# Patient Record
Sex: Female | Born: 1965 | Race: Black or African American | Hispanic: No | Marital: Single | Smoking: Never smoker
Health system: Southern US, Community
[De-identification: ages and names within clinical notes are randomized; demographics above are authoritative.]

## PROBLEM LIST (undated history)

## (undated) ENCOUNTER — Emergency Department (HOSPITAL_BASED_OUTPATIENT_CLINIC_OR_DEPARTMENT_OTHER): Payer: No Typology Code available for payment source | Source: Home / Self Care

## (undated) DIAGNOSIS — G4730 Sleep apnea, unspecified: Secondary | ICD-10-CM

## (undated) DIAGNOSIS — G8929 Other chronic pain: Secondary | ICD-10-CM

## (undated) DIAGNOSIS — M545 Low back pain, unspecified: Secondary | ICD-10-CM

## (undated) DIAGNOSIS — I10 Essential (primary) hypertension: Secondary | ICD-10-CM

## (undated) HISTORY — DX: Other chronic pain: G89.29

## (undated) HISTORY — DX: Low back pain, unspecified: M54.50

## (undated) HISTORY — PX: TUBAL LIGATION: SHX77

## (undated) HISTORY — PX: GASTRIC BYPASS: SHX52

## (undated) HISTORY — DX: Sleep apnea, unspecified: G47.30

## (undated) HISTORY — PX: BREAST REDUCTION SURGERY: SHX8

## (undated) HISTORY — PX: FOOT SURGERY: SHX648

## (undated) HISTORY — PX: ABDOMINAL HYSTERECTOMY: SHX81

---

## 2000-09-21 ENCOUNTER — Emergency Department (HOSPITAL_COMMUNITY): Admission: EM | Admit: 2000-09-21 | Discharge: 2000-09-21 | Payer: Self-pay

## 2001-01-04 ENCOUNTER — Emergency Department (HOSPITAL_COMMUNITY): Admission: EM | Admit: 2001-01-04 | Discharge: 2001-01-04 | Payer: Self-pay | Admitting: Emergency Medicine

## 2001-09-24 ENCOUNTER — Ambulatory Visit (HOSPITAL_COMMUNITY): Admission: RE | Admit: 2001-09-24 | Discharge: 2001-09-24 | Payer: Self-pay | Admitting: Otolaryngology

## 2001-10-04 ENCOUNTER — Encounter: Admission: RE | Admit: 2001-10-04 | Discharge: 2001-10-04 | Payer: Self-pay | Admitting: Otolaryngology

## 2001-10-04 ENCOUNTER — Encounter: Payer: Self-pay | Admitting: Otolaryngology

## 2001-10-28 ENCOUNTER — Ambulatory Visit (HOSPITAL_BASED_OUTPATIENT_CLINIC_OR_DEPARTMENT_OTHER): Admission: RE | Admit: 2001-10-28 | Discharge: 2001-10-28 | Payer: Self-pay | Admitting: Otolaryngology

## 2001-10-28 ENCOUNTER — Encounter (INDEPENDENT_AMBULATORY_CARE_PROVIDER_SITE_OTHER): Payer: Self-pay | Admitting: *Deleted

## 2001-11-22 ENCOUNTER — Encounter: Payer: Self-pay | Admitting: *Deleted

## 2001-11-22 ENCOUNTER — Encounter: Admission: RE | Admit: 2001-11-22 | Discharge: 2001-11-22 | Payer: Self-pay | Admitting: *Deleted

## 2001-12-14 ENCOUNTER — Encounter: Admission: RE | Admit: 2001-12-14 | Discharge: 2001-12-14 | Payer: Self-pay | Admitting: Family Medicine

## 2001-12-14 ENCOUNTER — Encounter: Payer: Self-pay | Admitting: Family Medicine

## 2003-05-31 ENCOUNTER — Encounter: Admission: RE | Admit: 2003-05-31 | Discharge: 2003-05-31 | Payer: Self-pay | Admitting: Family Medicine

## 2003-06-21 ENCOUNTER — Ambulatory Visit (HOSPITAL_COMMUNITY): Admission: RE | Admit: 2003-06-21 | Discharge: 2003-06-21 | Payer: Self-pay | Admitting: *Deleted

## 2003-06-21 ENCOUNTER — Encounter (INDEPENDENT_AMBULATORY_CARE_PROVIDER_SITE_OTHER): Payer: Self-pay | Admitting: Specialist

## 2008-01-26 ENCOUNTER — Emergency Department (HOSPITAL_BASED_OUTPATIENT_CLINIC_OR_DEPARTMENT_OTHER): Admission: EM | Admit: 2008-01-26 | Discharge: 2008-01-26 | Payer: Self-pay | Admitting: Emergency Medicine

## 2008-08-23 ENCOUNTER — Emergency Department (HOSPITAL_BASED_OUTPATIENT_CLINIC_OR_DEPARTMENT_OTHER): Admission: EM | Admit: 2008-08-23 | Discharge: 2008-08-23 | Payer: Self-pay | Admitting: Emergency Medicine

## 2010-09-20 NOTE — H&P (Signed)
[name], [name]                      ACCOUNT NO.:  [id number]   MEDICAL RECORD NO.:  [id number]                   PATIENT TYPE:  AMB   LOCATION:  SDC                                  FACILITY:  [facility]   PHYSICIAN:  [place] B. Earlene Plater, M.D.               DATE OF BIRTH:  1965/06/14   DATE OF ADMISSION:  DATE OF DISCHARGE:                                HISTORY & PHYSICAL   PREOPERATIVE DIAGNOSIS:  Abnormal uterine bleeding, possible endometrial  polyp.   HISTORY OF PRESENT ILLNESS:  A 45 year old African-American female gravida 2  para 2 with a long history of heavy vaginal bleeding which occurs  episodically.  There have been times in her life when she would only have  one or two menses a year and frequently skipped periods every month or so.  However, more recently she has had increasing vaginal bleeding, at times  daily.  Pelvic ultrasound and subsequent saline infusion ultrasound  suggestive of endometrial polyp.  Plans had been for hysteroscopy D&C on  June 28, 2003.  However, yesterday the patient presented to the office  with increasing heavy bleeding not controlled by norethindrone acetate 5 mg  three tablets daily.  Therefore, we have moved her surgery up to today for  [facility] and attempt at hysteroscopy.   PAST MEDICAL HISTORY:  IBS.   SURGICAL HISTORY:  1. Ear surgery.  2. Tubal ligation.  3. C-section x1.  4. Laparoscopy x1.   MEDICATIONS:  Zelnorm and Wellbutrin.   ALLERGIES:  None.   SOCIAL HISTORY:  No alcohol, tobacco, or other drugs.   FAMILY HISTORY:  Noncontributory.   REVIEW OF SYSTEMS:  Otherwise negative.   PHYSICAL EXAMINATION:  VITAL SIGNS:  Blood pressure 136/86, pulse 80, weight  228.  GENERAL:  Alert and oriented, no acute distress.  SKIN:  Warm and dry, no lesions.  HEART:  Regular rate and rhythm.  LUNGS:  Clear to auscultation.  ABDOMEN:  Obese.  Liver and spleen are normal.  No hernia.  No mass  palpable.  PELVIC:  Normal external  genitalia, vagina and cervix normal.  Moderate  amount of blood in the vagina, no active bleeding seen through the cervix.  The uterus is difficult to palpate due to body habitus.   Recent ultrasound in the office shows a retroverted, normal size uterus with  anterior focal endometrial thickenings consistent with polyps or  proliferative endometrium.   ASSESSMENT:  Abnormal bleeding; sonohysterogram suggestive of endometrial  polyps.   PLAN:  D&C and possible hysteroscopy with polyp removal.  Operative risks  discussed including infection, bleeding, uterine perforation, damage to  surrounding organs.  All questions answered; the patient wishes to proceed.  Gerri Spore B. Earlene Plater, M.D.    WBD/MEDQ  D:  06/21/2003  T:  06/21/2003  Job:  [id number]   cc:   Terrial Rhodes, N.P.  [facility]

## 2010-09-20 NOTE — Op Note (Signed)
[place]  Patient:    [name] Steele, [name] Steele Visit Number: [id number] MRN: [id number]          Service Type: DSU Location: [facility] Attending Physician:  Waldon Merl Dictated by:   Keturah Barre, M.D. Admit Date:  10/28/2001 Discharge Date: 10/28/2001   CC:         Greer Ee, M.D.   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  PROCEDURE:  Left mastoid tympanoplasty.  SURGEON:  Keturah Barre, M.D.  ANESTHESIA:  General endotracheal anesthesia with Onalee Hua A. Ivin Booty, M.D.  DESCRIPTION OF PROCEDURE:  The patient was placed in the supine position under general endotracheal anesthesia, the ear was prepped and draped using Betadine x3 and the usual ear drape, and then once the drape was completed and the scope was brought into place, the four quadrants external ear canal were injected with 1% Xylocaine with epinephrine and then a tympanotomy incision was carried out and canal skin and tympanic membrane was reflected anteriorly. The middle ear was found to be filled with cloudy fluid.  We suctioned this. The granulation tissue was noted around the cordotympany and stapes and this was removed for pathology and the exploration was extended high up into the pars flasita region and around the head of the malleolus where the pars flasita region which was perforated and showed a small amount of cholesteotomatous debris, but very small amount, mostly granulation tissue was removed.  The portion of the sac which was found was removed or exteriorized and then a graft will be taken and used to repair this area.  The osicular chain was checked, the round window reflex was present, though, the osicular chain appeared slightly  stiff.  The cordotympany was left intact.  The facial recess was in good condition as was the attic region.  We expected to find more granulation tissue and/or cholesteotomy in the attic region and therefore a postauricular incision was then made and the simple mastoidectomy was carried out where the mastoid bony structure was found to be filled with fluid or chickenfat type granulation tissue or just the heavy overgrowth of mastoid bone.  Once we got down to the antrum, we did find some more granulation tissue, but no more cholesteotoma.  The stapes short process was seen, the facial canal was found to be not dehiscent and the horizontal semicircular canal was also found to be within normal limits.  The tegman was also found to be in good condition as we were worried that there was going to be a dehiscence of the tegman.  This was not the case.  The sigmoid was also cleansed off and still some bone was left over this area protecting it from any suture trauma.  Once the mastoid was completed, then Gelfoam was placed within the attic and antrum region and closure with a double layer chromic catgut and then skin clips.  As the mastoid had been done, the temporalis fascia graft was taken and secured.  The temporalis fascia graft was then taken as we looked back into the external ear canal and we placed this over the pars flasita region and up the posterior and superior canal wall.  The remainder of the tympanic membrane and posterior canal wall was placed back in its original position and Gelfoam was then placed in the external ear  canal. The osicular chain was obviously found to be intact and stable and mobile. The Gelfoam was placed in the external ear canal followed by neosporin ointment, followed by cotton in the external ear canal, followed by a mastoid dressing. The patient tolerated the procedure well, facial nerve intact and is doing well postoperatively.  FOLLOW-UP:  In four days,  then in 2-1/2 weeks, then five weeks, then three months, six months, and a year. Dictated by:   Keturah Barre, M.D. Attending Physician:  Waldon Merl DD:  10/28/01 TD:  10/30/01 Job: [id number]

## 2010-09-20 NOTE — Op Note (Signed)
[name] Steele, [name] Steele                      ACCOUNT NO.:  [id number]   MEDICAL RECORD NO.:  [id number]                   PATIENT TYPE:  AMB   LOCATION:  SDC                                  [facility]ACILITY:  [facility]   PHYSICIAN:  [place] B. Earlene Plater, M.D.               DATE OF BIRTH:  1966/01/06   DATE OF PROCEDURE:  06/21/2003  DATE OF DISCHARGE:                                 OPERATIVE REPORT   PREOPERATIVE DIAGNOSES:  1. Abnormal bleeding not responding to medical management.  2. Suggestion of polyp on saline infusion ultrasound.   POSTOPERATIVE DIAGNOSES:  1. Abnormal bleeding not responding to medical management.  2. Suggestion of polyp on saline infusion ultrasound.   PROCEDURE:  Hysteroscopy D&C.   SURGEON:  [place] Holstein. Earlene Plater, M.D.   ANESTHESIA:  MAC and 20 mL 1% Nesacaine paracervical block.   SPECIMENS:  Endometrial curettings.   FINDINGS:  Shaggy endometrium; however, no polyps were identified.   FLUID DEFICIT:  20 mL.   BLOOD LOSS:  Less than 50 mL.   COMPLICATIONS:  None.   INDICATION:  Patient with a history of heavy and now daily bleeding.  Had  been controlled initially with progestin therapy; however, of late has been  bleeding heavily passing large clots with associated crampy pain.  Previous  saline infusion ultrasound in the office had been suggestive of endometrial  polyp.   DESCRIPTION OF PROCEDURE:  The patient taken to the operating room and MAC  anesthesia obtained.  She was placed in the Brandon stirrups and prepped and  draped in the standard fashion.  Then the bladder was emptied with a red  rubber catheter.   Exam under anesthesia showed a slightly enlarged, retroverted uterus.  No  adnexal masses palpable.   Paracervical block was placed and single-tooth placed on the anterior lip of  the cervix.  The cervix was patulous and allowed passage up to the #21  dilated without difficulty.   The endometrium was curetted, and a large amount of tissue  returned.  It  took several passes to obtain a gritty texture throughout.   The hysteroscope was then flushed with sorbitol and inserted.  Uterine  distention was marginal due to the patulous cervix.  I attempted to improve  this by doubly clamping the cervical os, and this improved the situation  somewhat.  Tubal ostia were visualized, and a shaggy endometrium seen.  But  no localized lesions or polyps were identified.   The curette was passed a few more times to clear the uterus and the  procedure terminated.  The bleeding at the end of the procedure was minimal.  The tenaculum was removed, and the cervix was hemostatic.   The patient tolerated the procedure well.  There were no complications.  She  was taken to the recovery room awake, alert, and in a stable condition.  Gerri Spore B. Earlene Plater, M.D.    WBD/MEDQ  D:  06/21/2003  T:  06/21/2003  Job:  [id number]   cc:   Tomi Bamberger, MD  [facility]. [facility]

## 2010-09-20 NOTE — H&P (Signed)
[place]  Patient:    [name] Steele, [name] Steele Visit Number: [id number] MRN: [id number]          Service Type: DSU Location: [facility] Attending Physician:  Waldon Merl Dictated by:   Keturah Barre, M.D. Admit Date:  10/28/2001 Discharge Date: 10/28/2001   CC:         Elvina Sidle, M.D.   History and Physical  HISTORY OF PRESENT ILLNESS: This is a 45 year old female, who enters with a long history of left mastoid problems.  She has had ear pain, infection, drainage.  She [place]. They said they were concerned about her left ear at that time.  She had been on multiple antibiotics and entered my office with external otitis media which, once we got it cleaned up, showed an attic traction pocket and perforation.  We placed her also on Cipro 750 mg and Floxin otic suspension. She did improve but then had further problem and more difficulty resolving this problem.  She continued to have pain in her left ear, continued to have a small amount of drainage though the ear resolved a considerable amount of the infection.  Once the infection was markedly improved we then obtained an audiogram, which showed a sensorineural hearing loss, assumed to be secondary to the previous ear infections.  It was quite symmetrical, and then we had a conductive hearing loss that was superimposed beyond that, giving her a sensorineural loss of approximately 40 decibels or 35 decibels and then another conductive element of 35 decibels in the lower frequencies.  Total left SRT was 55, on the right it was 30.  Flat tympanogram on the left and a normal tympanogram on the right.  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  With this we decided to do a left mastoid tympanoplasty to repair this attic retraction pocket and cholesteatoma, to repair the ossicular chain if necessary, and do mastoidectomy to resolve this either granulation of cholesteatoma as the CAT scan showed.  She now enters for this procedure, left mastoid tympanoplasty.  PAST MEDICAL HISTORY: Unremarkable.  She has never had any heart, pulmonary, or GI problems.  She goes to [facility], Dr. Elvina Sidle.  PAST SURGICAL HISTORY:  1. Laparoscopy in 2001.  2. Colonoscopy recently also.  ALLERGIES: She has no allergies to medications.  CURRENT MEDICATIONS: She takes no medications.  SOCIAL HISTORY: She does not drink and does not smoke.  PHYSICAL EXAMINATION:  GENERAL: She is a well-nourished, well-developed black female in no acute distress.  VITAL SIGNS: Weight 200 pounds.  Blood pressure 121/87, heart rate 85.  HEENT: The ear on the right looks quite clear except there is scar tissue from previous infection.  On the left she has an attic cholesteatoma and retraction pocket, but cleared the external otitis media reasonably well.  She has conductive hearing loss and an immobile tympanic membrane.  Mastoid is no longer tender.  The oral cavity is clear.  The nose is clear.  The larynx is free of any ulceration or mass.  No ulcerations or mass of lips,  teeth, or gums.  NECK: Free of any thyromegaly, cervical adenopathy, or mass.  CHEST: Clear.  No rales, rhonchi, or wheezes.  CARDIOVASCULAR: No murmurs or gallops.  ABDOMEN: Mildly obese.  EXTREMITIES: Unremarkable.  NEUROLOGIC: Cranial nerves intact facial, EOM mobility, tongue mobility, gag reflex, shoulder strength.  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 Dictated by:   Keturah Barre, M.D. Attending Physician:  Waldon Merl DD:  10/28/01 TD:  10/30/01 Job: [id number]

## 2010-12-02 DIAGNOSIS — E785 Hyperlipidemia, unspecified: Secondary | ICD-10-CM | POA: Insufficient documentation

## 2010-12-02 DIAGNOSIS — E559 Vitamin D deficiency, unspecified: Secondary | ICD-10-CM | POA: Insufficient documentation

## 2011-01-20 DIAGNOSIS — E538 Deficiency of other specified B group vitamins: Secondary | ICD-10-CM | POA: Insufficient documentation

## 2011-02-03 LAB — URINALYSIS, ROUTINE W REFLEX MICROSCOPIC
Bilirubin Urine: NEGATIVE
Glucose, UA: NEGATIVE
Hgb urine dipstick: NEGATIVE
Ketones, ur: NEGATIVE
Nitrite: NEGATIVE
Protein, ur: NEGATIVE
Specific Gravity, Urine: 1.017
Urobilinogen, UA: 1
pH: 7.5

## 2011-02-25 ENCOUNTER — Ambulatory Visit: Payer: Self-pay | Admitting: Specialist

## 2011-03-26 ENCOUNTER — Ambulatory Visit: Payer: Self-pay | Admitting: Specialist

## 2011-03-31 DIAGNOSIS — K5909 Other constipation: Secondary | ICD-10-CM | POA: Insufficient documentation

## 2011-03-31 DIAGNOSIS — K5904 Chronic idiopathic constipation: Secondary | ICD-10-CM | POA: Insufficient documentation

## 2011-04-05 ENCOUNTER — Ambulatory Visit: Payer: Self-pay | Admitting: Specialist

## 2011-05-20 DIAGNOSIS — G4733 Obstructive sleep apnea (adult) (pediatric): Secondary | ICD-10-CM | POA: Insufficient documentation

## 2011-06-04 ENCOUNTER — Ambulatory Visit: Payer: Self-pay | Admitting: Gastroenterology

## 2011-06-06 LAB — PATHOLOGY REPORT

## 2011-06-13 DIAGNOSIS — K5790 Diverticulosis of intestine, part unspecified, without perforation or abscess without bleeding: Secondary | ICD-10-CM | POA: Insufficient documentation

## 2011-10-06 ENCOUNTER — Ambulatory Visit: Payer: Self-pay | Admitting: Specialist

## 2012-03-11 ENCOUNTER — Ambulatory Visit: Payer: Self-pay | Admitting: Gastroenterology

## 2012-03-11 ENCOUNTER — Other Ambulatory Visit: Payer: Self-pay | Admitting: Specialist

## 2012-03-11 LAB — AMYLASE: Amylase: 27 U/L (ref 25–115)

## 2012-03-11 LAB — CBC WITH DIFFERENTIAL/PLATELET
Basophil %: 1.1 %
Eosinophil #: 0.1 10*3/uL (ref 0.0–0.7)
Eosinophil %: 1.9 %
HGB: 13.1 g/dL (ref 12.0–16.0)
Lymphocyte %: 34.9 %
MCH: 31.4 pg (ref 26.0–34.0)
MCHC: 34.7 g/dL (ref 32.0–36.0)
Monocyte #: 0.2 x10 3/mm (ref 0.2–0.9)
Neutrophil %: 57.4 %
Platelet: 299 10*3/uL (ref 150–440)
RBC: 4.18 10*6/uL (ref 3.80–5.20)

## 2012-03-11 LAB — COMPREHENSIVE METABOLIC PANEL
Anion Gap: 11 (ref 7–16)
Bilirubin,Total: 0.5 mg/dL (ref 0.2–1.0)
Chloride: 110 mmol/L — ABNORMAL HIGH (ref 98–107)
EGFR (African American): >60
Osmolality: 286 (ref 275–301)
Potassium: 3.5 mmol/L (ref 3.5–5.1)
SGOT(AST): 21 U/L (ref 15–37)
Total Protein: 7.1 g/dL (ref 6.4–8.2)

## 2012-04-29 ENCOUNTER — Ambulatory Visit: Payer: Self-pay | Admitting: Gastroenterology

## 2012-09-07 ENCOUNTER — Emergency Department (HOSPITAL_BASED_OUTPATIENT_CLINIC_OR_DEPARTMENT_OTHER)
Admission: EM | Admit: 2012-09-07 | Discharge: 2012-09-07 | Disposition: A | Discharge status: Home / Self Care | Payer: Medicare Other | Attending: Emergency Medicine | Admitting: Emergency Medicine

## 2012-09-07 ENCOUNTER — Encounter (HOSPITAL_BASED_OUTPATIENT_CLINIC_OR_DEPARTMENT_OTHER): Payer: Self-pay | Admitting: *Deleted

## 2012-09-07 DIAGNOSIS — R55 Syncope and collapse: Secondary | ICD-10-CM

## 2012-09-07 LAB — COMPREHENSIVE METABOLIC PANEL
ALT: 21 U/L (ref 0–35)
Alkaline Phosphatase: 164 U/L — ABNORMAL HIGH (ref 39–117)
BUN: 12 mg/dL (ref 6–23)
CO2: 24 mEq/L (ref 19–32)
Chloride: 106 mEq/L (ref 96–112)
GFR calc Af Amer: >90 mL/min (ref >90)
GFR calc non Af Amer: >90 mL/min (ref >90)
Glucose, Bld: 94 mg/dL (ref 70–99)
Potassium: 3.9 mEq/L (ref 3.5–5.1)
Sodium: 140 mEq/L (ref 135–145)
Total Bilirubin: 0.2 mg/dL — ABNORMAL LOW (ref 0.3–1.2)

## 2012-09-07 LAB — URINALYSIS, ROUTINE W REFLEX MICROSCOPIC
Hgb urine dipstick: NEGATIVE
Nitrite: NEGATIVE
Specific Gravity, Urine: 1.034 — ABNORMAL HIGH (ref 1.005–1.030)
Urobilinogen, UA: 1 mg/dL (ref 0.0–1.0)
pH: 5.5 (ref 5.0–8.0)

## 2012-09-07 LAB — CBC WITH DIFFERENTIAL/PLATELET
Hemoglobin: 12.9 g/dL (ref 12.0–15.0)
Lymphocytes Relative: 41 % (ref 12–46)
Lymphs Abs: 2.8 10*3/uL (ref 0.7–4.0)
Monocytes Relative: 6 % (ref 3–12)
Neutrophils Relative %: 51 % (ref 43–77)
Platelets: 250 10*3/uL (ref 150–400)
RBC: 3.99 MIL/uL (ref 3.87–5.11)
WBC: 6.7 10*3/uL (ref 4.0–10.5)

## 2012-09-07 LAB — TROPONIN I: Troponin I: <0.3 ng/mL (ref <0.30)

## 2012-09-07 NOTE — ED Provider Notes (Signed)
History  This chart was scribed for [name] Octave, MD by [name] Steele, ED Scribe. The patient was seen in room MH12/MH12. Patient's care was started at 0029.   CSN: [id number]  Arrival date & time 09/07/12  0003   First MD Initiated Contact with Patient 09/07/12 0029      Chief Complaint  Patient presents with  . Dizziness    The history is provided by the patient. No language interpreter was used.    HPI Comments: [name] Steele is a 47 y.o. female who presents to the Emergency Department complaining of intermittent, brief episodes dizziness, lightheadedness and near syncope onset 1 day ago. She states that these episodes usually occur with position changes and after standing up and last several seconds. She says that she is feeling normal at this time. Patient reports a history of hypokalemia. She is concerned that today's symptoms are related to low potassium levels. Patient denies shortness of breath, chest pain, abdominal pain, nausea, vomiting, diarrhea, headaches or any other symptoms. She reports no other chronic medical conditions.  History reviewed. No pertinent past medical history.  Past Surgical History  Procedure Laterality Date  . Gastric bypass    . Abdominal hysterectomy      No family history on file.  History  Substance Use Topics  . Smoking status: Never Smoker   . Smokeless tobacco: Not on file  . Alcohol Use: No    OB History   Grav Para Term Preterm Abortions TAB SAB Ect Mult Living                  Review of Systems A complete 10 system review of systems was obtained and all systems are negative except as noted in the HPI and PMH.   Allergies  Review of patient's allergies indicates no known allergies.  Home Medications  No current outpatient prescriptions on file.  Triage Vitals: BP 125/93  Pulse 60  Temp(Src) 98.3 F (36.8 C) (Oral)  Resp 20  Ht 5\' 6"  (1.676 m)  Wt 179 lb (81.194 kg)  BMI 28.91 kg/m2  SpO2 98%  Physical Exam   Constitutional: She is oriented to person, place, and time. She appears well-developed and well-nourished.  HENT:  Head: Normocephalic and atraumatic.  Dry mucous membranes.  Eyes: Conjunctivae and EOM are normal. Pupils are equal, round, and reactive to light.  Neck: Normal range of motion. Neck supple.  Cardiovascular: Normal rate, regular rhythm and normal heart sounds.   Pulmonary/Chest: Effort normal and breath sounds normal.  Abdominal: Soft. There is no tenderness.  Neurological: She is alert and oriented to person, place, and time. She has normal reflexes. No cranial nerve deficit. She exhibits normal muscle tone. She displays a negative Romberg sign. Coordination normal.  No ataxia on finger to nose bilaterally. No pronator drift. 5/5 strength throughout. CN 2-12 intact. Equal grip strength. Sensation intact. Gait is normal. Romberg negative.  Skin: Skin is warm and dry.    ED Course  Procedures (including critical care time) DIAGNOSTIC STUDIES: Oxygen Saturation is 98% on room air, normal by my interpretation.    COORDINATION OF CARE: 12:49 AM- Patient informed of current plan for treatment and evaluation and agrees with plan at this time.      Labs Reviewed  URINALYSIS, ROUTINE W REFLEX MICROSCOPIC - Abnormal; Notable for the following:    Specific Gravity, Urine 1.034 (*)    Bilirubin Urine SMALL (*)    All other components within normal limits  COMPREHENSIVE  METABOLIC PANEL - Abnormal; Notable for the following:    Alkaline Phosphatase 164 (*)    Total Bilirubin 0.2 (*)    All other components within normal limits  CBC WITH DIFFERENTIAL  TROPONIN I   No results found.   1. Near syncope       MDM  3 episodes of dizziness with standing today and intermittent. Concerned about low potassium. No chest pain or shortness of breath.  Patient's friend states patient doesn't drink much water.  Positional dizziness throughout the day today. Concerned about low  potassium. Denies vertigo.  Endorses lightheadedness with standing.   Nonfocal neurological exam. Normal gait. No ataxia. Romberg negative. Doubt posterior CVA.  EKG normal sinus rhythm. Electrolytes normal.  Feeling at baseline in the ED. Ambulatory without dizziness or lightheadedness.   Date: 09/07/2012  Rate: 64  Rhythm: normal sinus rhythm  QRS Axis: normal  Intervals: normal  ST/T Wave abnormalities: normal  Conduction Disutrbances:none  Narrative Interpretation:   Old EKG Reviewed: none available     I personally performed the services described in this documentation, which was scribed in my presence. The recorded information has been reviewed and is accurate.    [name] Octave, MD 09/07/12 [phone]

## 2012-09-07 NOTE — ED Notes (Signed)
Has been having dizziness and near syncope spells through out the day. States she feels like her K+ is the cause. She is alert and oriented at triage.

## 2012-09-07 NOTE — ED Notes (Signed)
MD at bedside. 

## 2012-11-12 ENCOUNTER — Ambulatory Visit: Payer: Self-pay | Admitting: Gastroenterology

## 2012-11-22 ENCOUNTER — Ambulatory Visit: Payer: Self-pay | Admitting: Gastroenterology

## 2013-01-05 ENCOUNTER — Encounter (HOSPITAL_BASED_OUTPATIENT_CLINIC_OR_DEPARTMENT_OTHER): Payer: Self-pay | Admitting: Student

## 2013-01-05 ENCOUNTER — Emergency Department (HOSPITAL_BASED_OUTPATIENT_CLINIC_OR_DEPARTMENT_OTHER)
Admission: EM | Admit: 2013-01-05 | Discharge: 2013-01-05 | Disposition: A | Discharge status: Home / Self Care | Payer: Medicare Other | Attending: Emergency Medicine | Admitting: Emergency Medicine

## 2013-01-05 DIAGNOSIS — S139XXA Sprain of joints and ligaments of unspecified parts of neck, initial encounter: Secondary | ICD-10-CM | POA: Insufficient documentation

## 2013-01-05 DIAGNOSIS — Y9389 Activity, other specified: Secondary | ICD-10-CM | POA: Insufficient documentation

## 2013-01-05 DIAGNOSIS — S161XXA Strain of muscle, fascia and tendon at neck level, initial encounter: Secondary | ICD-10-CM

## 2013-01-05 DIAGNOSIS — Y92410 Unspecified street and highway as the place of occurrence of the external cause: Secondary | ICD-10-CM | POA: Insufficient documentation

## 2013-01-05 MED ORDER — HYDROCODONE-ACETAMINOPHEN 5-325 MG PO TABS: 2.0000 | ORAL_TABLET | ORAL | Status: DC | PRN

## 2013-01-05 MED ORDER — HYDROCODONE-ACETAMINOPHEN 5-325 MG PO TABS
2.0000 | ORAL_TABLET | Freq: Once | ORAL | Status: AC
  Administered 2013-01-05: 2 via ORAL
  Filled 2013-01-05: qty 2

## 2013-01-05 NOTE — ED Provider Notes (Signed)
Medical screening examination/treatment/procedure(s) were performed by non-physician practitioner and as supervising physician I was immediately available for consultation/collaboration.   Charles B. Sheldon, MD 01/05/13 2305 
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
## 2013-01-05 NOTE — ED Notes (Signed)
PA at bedside.
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
## 2013-01-10 ENCOUNTER — Emergency Department (HOSPITAL_BASED_OUTPATIENT_CLINIC_OR_DEPARTMENT_OTHER)
Admission: EM | Admit: 2013-01-10 | Discharge: 2013-01-10 | Disposition: A | Discharge status: Home / Self Care | Payer: Medicare Other | Attending: Emergency Medicine | Admitting: Emergency Medicine

## 2013-01-10 ENCOUNTER — Encounter (HOSPITAL_BASED_OUTPATIENT_CLINIC_OR_DEPARTMENT_OTHER): Payer: Self-pay

## 2013-01-10 DIAGNOSIS — S0990XD Unspecified injury of head, subsequent encounter: Secondary | ICD-10-CM

## 2013-01-10 DIAGNOSIS — R51 Headache: Secondary | ICD-10-CM | POA: Insufficient documentation
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
## 2013-11-03 ENCOUNTER — Encounter (HOSPITAL_BASED_OUTPATIENT_CLINIC_OR_DEPARTMENT_OTHER): Payer: Self-pay | Admitting: Emergency Medicine

## 2013-11-03 ENCOUNTER — Emergency Department (HOSPITAL_BASED_OUTPATIENT_CLINIC_OR_DEPARTMENT_OTHER)
Admission: EM | Admit: 2013-11-03 | Discharge: 2013-11-03 | Disposition: A | Discharge status: Home / Self Care | Payer: Medicare HMO | Attending: Emergency Medicine | Admitting: Emergency Medicine

## 2013-11-03 DIAGNOSIS — L509 Urticaria, unspecified: Secondary | ICD-10-CM | POA: Diagnosis not present

## 2013-11-03 DIAGNOSIS — R21 Rash and other nonspecific skin eruption: Secondary | ICD-10-CM | POA: Diagnosis present

## 2013-11-03 MED ORDER — HYDROCORTISONE 1 % EX CREA: TOPICAL_CREAM | CUTANEOUS | Status: DC

## 2013-11-03 MED ORDER — HYDROXYZINE HCL 25 MG PO TABS: 25.0000 mg | ORAL_TABLET | Freq: Four times a day (QID) | ORAL | Status: DC

## 2013-11-03 NOTE — ED Provider Notes (Signed)
CSN: [id number]     Arrival date & time 11/03/13  0713 History  This chart was scribed for [name], MD by Shari Heritage, ED Scribe. The patient was seen in room MHTR2/MHTR2. Patient's care was started at 7:44 AM.   Chief Complaint  Patient presents with  . Rash    The history is provided by the patient. No language interpreter was used.    HPI Comments: [name] Steele is a 48 y.o. female who presents to the Emergency Department complaining of gradually worsening, mild, pruritic rash to her upper and lower extremities that began last night. She says that rash is not painful. Patient states that she first noticed a bump that looked like a mosquito bite, then when she woke up this morning she noticed rash had spread. Patient reports that no one at home as a similar rash. There is no oral or genital involvement. Patient denies recent travel. She denies tick bites or spending times in the woods recently. She denies use of new lotions, detergents, soaps. She has not worn any new clothes or eaten new foods. She has not taken any medications for symptoms relief - she says that she took Benadryl 1 year ago after gastric bypass surgery, but she says it made her itch more so she has not tried this today. She denies shortness of breath, chest pain, dysphagia, sore throat, or other symptoms at this time. Patient has a history of back pain. She does not take any medications on a regular basis.   History reviewed. No pertinent past medical history. Past Surgical History  Procedure Laterality Date  . Gastric bypass    . Abdominal hysterectomy     History reviewed. No pertinent family history. History  Substance Use Topics  . Smoking status: Never Smoker   . Smokeless tobacco: Not on file  . Alcohol Use: No   OB History   Grav Para Term Preterm Abortions TAB SAB Ect Mult Living                 Review of Systems A complete 10 system review of systems was obtained and all systems are negative  except as noted in the HPI and PMH.   Allergies  Review of patient's allergies indicates no known allergies.  Home Medications   Prior to Admission medications   Medication Sig Start Date End Date Taking? Authorizing Provider  hydrocortisone cream 1 % Apply to affected area 2 times daily 11/03/13   [name], MD  hydrOXYzine (ATARAX/VISTARIL) 25 MG tablet Take 1 tablet (25 mg total) by mouth every 6 (six) hours. 11/03/13   [name], MD   Triage Vitals: BP 137/97  Pulse 76  Temp(Src) 97.5 F (36.4 C) (Oral)  Resp 18  Ht 5\' 6"  (1.676 m)  Wt 170 lb (77.111 kg)  BMI 27.45 kg/m2  SpO2 100%  Physical Exam  Nursing note and vitals reviewed. Constitutional: She is oriented to person, place, and time. She appears well-developed and well-nourished. No distress.  HENT:  Head: Normocephalic and atraumatic.  Mouth/Throat: Oropharynx is clear and moist. No oropharyngeal exudate.  Eyes: Conjunctivae and EOM are normal. Pupils are equal, round, and reactive to light.  Neck: Normal range of motion. Neck supple.  No meningismus.  Cardiovascular: Normal rate, regular rhythm, normal heart sounds and intact distal pulses.   No murmur heard. Pulmonary/Chest: Effort normal and breath sounds normal. No respiratory distress. She has no wheezes. She has no rales.  Abdominal: Soft. There is no  tenderness. There is no rebound and no guarding.  Musculoskeletal: Normal range of motion. She exhibits no edema and no tenderness.  Neurological: She is alert and oriented to person, place, and time. No cranial nerve deficit. She exhibits normal muscle tone. Coordination normal.  No ataxia on finger to nose bilaterally. No pronator drift. 5/5 strength throughout. CN 2-12 intact. Negative Romberg. Equal grip strength. Sensation intact. Gait is normal.   Skin: Skin is warm. Rash noted. Rash is urticarial.  Scattered urticaria most noticeable on bilateral posterior upper arms and medial thighs.   Psychiatric: She has a normal mood and affect. Her behavior is normal.    ED Course  Procedures (including critical care time) DIAGNOSTIC STUDIES: Oxygen Saturation is 100% on room air, normal by my interpretation.    COORDINATION OF CARE: 7:48 AM- Patient informed of current plan for treatment and evaluation and agrees with plan at this time.    MDM   Final diagnoses:  Urticaria   Urticaria to bilateral posterior arms and medial thighs.  No oral or genital involvement. OP clear.   No CP or SOB. Denies new exposures. Questionable insect bite.  Patient reports benadryl "makes her itch". Treat with hydrocortisone and atarax.  Follow up with PCP. Return precautions discussed.   I personally performed the services described in this documentation, which was scribed in my presence. The recorded information has been reviewed and is accurate.   Glynn OctaveStephen Demetria Iwai, MD 11/03/13 [phone]

## 2013-11-03 NOTE — Discharge Instructions (Signed)
Hives Hives are itchy, red, swollen areas of the skin. They can vary in size and location on your body. Hives can come and go for hours or several days (acute hives) or for several weeks (chronic hives). Hives do not spread from person to person (noncontagious). They may get worse with scratching, exercise, and emotional stress. CAUSES   Allergic reaction to food, additives, or drugs.  Infections, including the common cold.  Illness, such as vasculitis, lupus, or thyroid disease.  Exposure to sunlight, heat, or cold.  Exercise.  Stress.  Contact with chemicals. SYMPTOMS   Red or white swollen patches on the skin. The patches may change size, shape, and location quickly and repeatedly.  Itching.  Swelling of the hands, feet, and face. This may occur if hives develop deeper in the skin. DIAGNOSIS  Your caregiver can usually tell what is wrong by performing a physical exam. Skin or blood tests may also be done to determine the cause of your hives. In some cases, the cause cannot be determined. TREATMENT  Mild cases usually get better with medicines such as antihistamines. Severe cases may require an emergency epinephrine injection. If the cause of your hives is known, treatment includes avoiding that trigger.  HOME CARE INSTRUCTIONS   Avoid causes that trigger your hives.  Take antihistamines as directed by your caregiver to reduce the severity of your hives. Non-sedating or low-sedating antihistamines are usually recommended. Do not drive while taking an antihistamine.  Take any other medicines prescribed for itching as directed by your caregiver.  Wear loose-fitting clothing.  Keep all follow-up appointments as directed by your caregiver. SEEK MEDICAL CARE IF:   You have persistent or severe itching that is not relieved with medicine.  You have painful or swollen joints. SEEK IMMEDIATE MEDICAL CARE IF:   You have a fever.  Your tongue or lips are swollen.  You have  trouble breathing or swallowing.  You feel tightness in the throat or chest.  You have abdominal pain. These problems may be the first sign of a life-threatening allergic reaction. Call your local emergency services (911 in [place] MAKE SURE YOU:   Understand these instructions.  Will watch your condition.  Will get help right away if you are not doing well or get worse. Document Released: 04/21/2005 Document Revised: 04/26/2013 Document Reviewed: 07/15/2011 [employer] Patient Information 2015 [employer]. This information is not intended to replace advice given to you by your health care provider. Make sure you discuss any questions you have with your health care provider.  

## 2013-11-03 NOTE — ED Notes (Signed)
Pt reports rash since last night. No meds PTA

## 2014-08-29 ENCOUNTER — Encounter (HOSPITAL_BASED_OUTPATIENT_CLINIC_OR_DEPARTMENT_OTHER): Payer: Self-pay | Admitting: *Deleted

## 2014-08-29 ENCOUNTER — Emergency Department (HOSPITAL_BASED_OUTPATIENT_CLINIC_OR_DEPARTMENT_OTHER): Payer: Medicare HMO

## 2014-08-29 ENCOUNTER — Emergency Department (HOSPITAL_BASED_OUTPATIENT_CLINIC_OR_DEPARTMENT_OTHER)
Admission: EM | Admit: 2014-08-29 | Discharge: 2014-08-30 | Disposition: A | Discharge status: Home / Self Care | Payer: Medicare HMO | Attending: Emergency Medicine | Admitting: Emergency Medicine

## 2014-08-29 DIAGNOSIS — R0789 Other chest pain: Secondary | ICD-10-CM

## 2014-08-29 DIAGNOSIS — R52 Pain, unspecified: Secondary | ICD-10-CM

## 2014-08-29 DIAGNOSIS — Z79899 Other long term (current) drug therapy: Secondary | ICD-10-CM | POA: Insufficient documentation

## 2014-08-29 DIAGNOSIS — R079 Chest pain, unspecified: Secondary | ICD-10-CM | POA: Diagnosis present

## 2014-08-29 DIAGNOSIS — Z7952 Long term (current) use of systemic steroids: Secondary | ICD-10-CM | POA: Insufficient documentation

## 2014-08-29 DIAGNOSIS — M546 Pain in thoracic spine: Secondary | ICD-10-CM | POA: Diagnosis not present

## 2014-08-29 LAB — CBC WITH DIFFERENTIAL/PLATELET
BASOS ABS: 0 10*3/uL (ref 0.0–0.1)
Basophils Relative: 0 % (ref 0–1)
EOS ABS: 0.1 10*3/uL (ref 0.0–0.7)
Eosinophils Relative: 1 % (ref 0–5)
HCT: 40.4 % (ref 36.0–46.0)
HEMOGLOBIN: 13.8 g/dL (ref 12.0–15.0)
LYMPHS ABS: 2 10*3/uL (ref 0.7–4.0)
Lymphocytes Relative: 27 % (ref 12–46)
MCH: 31.7 pg (ref 26.0–34.0)
MCHC: 34.2 g/dL (ref 30.0–36.0)
MCV: 92.7 fL (ref 78.0–100.0)
MONOS PCT: 7 % (ref 3–12)
Monocytes Absolute: 0.5 10*3/uL (ref 0.1–1.0)
Neutro Abs: 4.8 10*3/uL (ref 1.7–7.7)
Neutrophils Relative %: 65 % (ref 43–77)
Platelets: 229 10*3/uL (ref 150–400)
RBC: 4.36 MIL/uL (ref 3.87–5.11)
RDW: 12.5 % (ref 11.5–15.5)
WBC: 7.4 10*3/uL (ref 4.0–10.5)

## 2014-08-29 LAB — D-DIMER, QUANTITATIVE: D-Dimer, Quant: <0.27 ug/mL-FEU (ref 0.00–0.48)

## 2014-08-29 LAB — BASIC METABOLIC PANEL
ANION GAP: 6 (ref 5–15)
BUN: 14 mg/dL (ref 6–23)
CALCIUM: 8.7 mg/dL (ref 8.4–10.5)
CO2: 28 mmol/L (ref 19–32)
Chloride: 105 mmol/L (ref 96–112)
Creatinine, Ser: 0.71 mg/dL (ref 0.50–1.10)
GFR calc non Af Amer: >90 mL/min (ref >90)
Glucose, Bld: 93 mg/dL (ref 70–99)
Potassium: 3.6 mmol/L (ref 3.5–5.1)
SODIUM: 139 mmol/L (ref 135–145)

## 2014-08-29 LAB — TROPONIN I

## 2014-08-29 NOTE — ED Notes (Addendum)
Pt c/o mid chest pain onset this am, cramping,  Comes and goes, radiates to back Denies n/v  Onset this am now getting worse  Pt states she had been at [facility] since1500 this pm,  Had labs drawn,  Got tired of waiting and left there and came here

## 2014-08-29 NOTE — ED Provider Notes (Addendum)
CSN: [id number]     Arrival date & time 08/29/14  2027 History  This chart was scribed for Paula Libra, MD by Phillis Haggis, ED Scribe. This patient was seen in room MH06/MH06 and patient care was started at 11:02 PM.   Chief Complaint  Patient presents with  . Chest Pain   Patient is a 49 y.o. female presenting with chest pain. The history is provided by the patient. No language interpreter was used.  Chest Pain   HPI Comments: [name] Steele is a 49 y.o. female who presents to the Emergency Department complaining of right chest pain that radiates to her back onset about 13 hours ago. The pain is constant but is associated with intermittent paroxysmal exacerbations. She describes the pain as spasm-like. She states that she called her PCP today who stated that she would prescribe her prednisone. The pain is worse with movement and palpation.  She states that she went to [facility] where she had labs and an EKG done, but got tired of waiting and left and came here. She states that the pain was worst between 4 and 7 PM, but has eased since then. She states that the pain hurt "really badly." She reports associated pain when coughing and taking deep breaths. She reports that she is afraid to cough because of the pain. She denies cold symptoms, nausea, or vomiting. She denies any recent injuries. She has a history of back problems but not in the present location.    History reviewed. No pertinent past medical history. Past Surgical History  Procedure Laterality Date  . Gastric bypass    . Abdominal hysterectomy     No family history on file. History  Substance Use Topics  . Smoking status: Never Smoker   . Smokeless tobacco: Not on file  . Alcohol Use: No   OB History    No data available     Review of Systems  A complete 10 system review of systems was obtained and all systems are negative except as noted in the HPI and PMH.   Allergies  Review of patient's allergies  indicates no known allergies.  Home Medications   Prior to Admission medications   Medication Sig Start Date End Date Taking? Authorizing Provider  hydrocortisone cream 1 % Apply to affected area 2 times daily 11/03/13   [name], MD  hydrOXYzine (ATARAX/VISTARIL) 25 MG tablet Take 1 tablet (25 mg total) by mouth every 6 (six) hours. 11/03/13   [name], MD   BP 115/113 mmHg  Temp(Src) 98.3 F (36.8 C) (Oral)  Resp 20  Ht  (1.676 m)  Wt 200 lb (90.719 kg)  BMI 32.30 kg/m2  SpO2 100%   Physical Exam  Nursing note and vitals reviewed. General: Well-developed, well-nourished female in no acute distress; appearance consistent with age of record HENT: normocephalic; atraumatic Eyes: pupils equal, round and reactive to light; extraocular muscles intact Neck: supple Heart: regular rate and rhythm; no murmurs, rubs or gallops Lungs: clear to auscultation bilaterally Chest: anterior and posterior right-sided chest wall tenderness without palpable muscle spasm Abdomen: soft; nondistended; nontender; no masses or hepatosplenomegaly; bowel sounds present Extremities: No deformity; full range of motion; pulses normal Neurologic: Awake, alert and oriented; motor function intact in all extremities and symmetric; no facial droop Skin: Warm and dry Psychiatric: Normal mood and affect  ED Course  Procedures (including critical care time)   EKG Interpretation   Date/Time:  Tuesday August 29 2014 20:36:40 EDT  Ventricular Rate:  85 PR Interval:  168 QRS Duration: 68 QT Interval:  336 QTC Calculation: 399 R Axis:   4 Text Interpretation:  Normal sinus rhythm Normal ECG No significant change  was found Confirmed by Read Drivers  MD, Jonny Ruiz ([id number]) on 08/29/2014 10:55:07 PM      MDM   Nursing notes and vitals signs, including pulse oximetry, reviewed.  Summary of this visit's results, reviewed by myself:  Labs:  Results for orders placed or performed during the hospital  encounter of 08/29/14 (from the past 24 hour(s))  CBC with Differential/Platelet     Status: None   Collection Time: 08/29/14 10:30 PM  Result Value Ref Range   WBC 7.4 4.0 - 10.5 K/uL   RBC 4.36 3.87 - 5.11 MIL/uL   Hemoglobin 13.8 12.0 - 15.0 g/dL   HCT 86.5 78.4 - 69.6 %   MCV 92.7 78.0 - 100.0 fL   MCH 31.7 26.0 - 34.0 pg   MCHC 34.2 30.0 - 36.0 g/dL   RDW 29.5 28.4 - 13.2 %   Platelets 229 150 - 400 K/uL   Neutrophils Relative % 65 43 - 77 %   Neutro Abs 4.8 1.7 - 7.7 K/uL   Lymphocytes Relative 27 12 - 46 %   Lymphs Abs 2.0 0.7 - 4.0 K/uL   Monocytes Relative 7 3 - 12 %   Monocytes Absolute 0.5 0.1 - 1.0 K/uL   Eosinophils Relative 1 0 - 5 %   Eosinophils Absolute 0.1 0.0 - 0.7 K/uL   Basophils Relative 0 0 - 1 %   Basophils Absolute 0.0 0.0 - 0.1 K/uL  Basic metabolic panel     Status: None   Collection Time: 08/29/14 10:30 PM  Result Value Ref Range   Sodium 139 135 - 145 mmol/L   Potassium 3.6 3.5 - 5.1 mmol/L   Chloride 105 96 - 112 mmol/L   CO2 28 19 - 32 mmol/L   Glucose, Bld 93 70 - 99 mg/dL   BUN 14 6 - 23 mg/dL   Creatinine, Ser 4.40 0.50 - 1.10 mg/dL   Calcium 8.7 8.4 - 10.2 mg/dL   GFR calc non Af Amer >90 >90 mL/min   GFR calc Af Amer >90 >90 mL/min   Anion gap 6 5 - 15  Troponin I     Status: None   Collection Time: 08/29/14 10:30 PM  Result Value Ref Range   Troponin I <0.03 <0.031 ng/mL  D-dimer, quantitative     Status: None   Collection Time: 08/29/14 10:30 PM  Result Value Ref Range   D-Dimer, Quant <0.27 0.00 - 0.48 ug/mL-FEU    Imaging Studies: Dg Chest 2 View  08/30/2014   CLINICAL DATA:  Intermittent right-sided chest pain and shortness of breath, acute onset. Initial encounter.  EXAM: CHEST  2 VIEW  COMPARISON:  Chest radiograph performed earlier today at 4:23 p.m.  FINDINGS: The lungs are well-aerated. Mild left basilar atelectasis is noted. There is no evidence of pleural effusion or pneumothorax.  The heart is borderline enlarged. No  acute osseous abnormalities are seen.  IMPRESSION: Mild left basilar atelectasis noted; borderline cardiomegaly.   Electronically Signed   By: Roanna Raider M.D.   On: 08/30/2014 00:28   Chest pain is reproducible. It may represent muscle spasms although no spasms were palpated. History and exam are not consistent with cardiac etiology.  I personally performed the services described in this documentation, which was scribed in my presence. The recorded  information has been reviewed and is accurate.   Paula Libra, MD 08/30/14 0981  Paula Libra, MD 08/30/14 [phone]

## 2014-08-30 ENCOUNTER — Emergency Department (HOSPITAL_BASED_OUTPATIENT_CLINIC_OR_DEPARTMENT_OTHER): Payer: Medicare HMO

## 2014-08-30 ENCOUNTER — Emergency Department (HOSPITAL_BASED_OUTPATIENT_CLINIC_OR_DEPARTMENT_OTHER)
Admission: EM | Admit: 2014-08-30 | Discharge: 2014-08-30 | Disposition: A | Discharge status: Home / Self Care | Payer: Medicare HMO | Attending: Emergency Medicine | Admitting: Emergency Medicine

## 2014-08-30 ENCOUNTER — Encounter (HOSPITAL_BASED_OUTPATIENT_CLINIC_OR_DEPARTMENT_OTHER): Payer: Self-pay | Admitting: *Deleted

## 2014-08-30 DIAGNOSIS — G8929 Other chronic pain: Secondary | ICD-10-CM | POA: Insufficient documentation

## 2014-08-30 DIAGNOSIS — Z7952 Long term (current) use of systemic steroids: Secondary | ICD-10-CM | POA: Diagnosis not present

## 2014-08-30 DIAGNOSIS — M549 Dorsalgia, unspecified: Secondary | ICD-10-CM | POA: Diagnosis present

## 2014-08-30 DIAGNOSIS — M546 Pain in thoracic spine: Secondary | ICD-10-CM | POA: Diagnosis not present

## 2014-08-30 DIAGNOSIS — R0789 Other chest pain: Secondary | ICD-10-CM | POA: Diagnosis not present

## 2014-08-30 LAB — URINE MICROSCOPIC-ADD ON

## 2014-08-30 LAB — URINALYSIS, ROUTINE W REFLEX MICROSCOPIC
GLUCOSE, UA: NEGATIVE mg/dL
Ketones, ur: 15 mg/dL — AB
LEUKOCYTES UA: NEGATIVE
NITRITE: NEGATIVE
PH: 5.5 (ref 5.0–8.0)
Protein, ur: NEGATIVE mg/dL
SPECIFIC GRAVITY, URINE: 1.034 — AB (ref 1.005–1.030)
Urobilinogen, UA: 1 mg/dL (ref 0.0–1.0)

## 2014-08-30 MED ORDER — FENTANYL CITRATE (PF) 100 MCG/2ML IJ SOLN
100.0000 ug | Freq: Once | INTRAMUSCULAR | Status: AC
  Administered 2014-08-30: 100 ug via INTRAMUSCULAR
  Filled 2014-08-30: qty 2

## 2014-08-30 MED ORDER — AZITHROMYCIN 250 MG PO TABS: ORAL_TABLET | ORAL | Status: DC

## 2014-08-30 MED ORDER — OXYMETAZOLINE HCL 0.05 % NA SOLN
NASAL | Status: AC
  Filled 2014-08-30: qty 15

## 2014-08-30 MED ORDER — HYDROCODONE-ACETAMINOPHEN 5-325 MG PO TABS: 1.0000 | ORAL_TABLET | Freq: Four times a day (QID) | ORAL | Status: DC | PRN

## 2014-08-30 MED ORDER — DIAZEPAM 5 MG PO TABS: 5.0000 mg | ORAL_TABLET | Freq: Two times a day (BID) | ORAL | Status: DC | PRN

## 2014-08-30 MED ORDER — FENTANYL CITRATE (PF) 100 MCG/2ML IJ SOLN
100.0000 ug | Freq: Once | INTRAMUSCULAR | Status: AC
Start: 2014-08-30 — End: 2014-08-30
  Administered 2014-08-30: 100 ug via INTRAVENOUS
  Filled 2014-08-30: qty 2

## 2014-08-30 MED ORDER — DIAZEPAM 5 MG PO TABS
5.0000 mg | ORAL_TABLET | Freq: Once | ORAL | Status: AC
  Administered 2014-08-30: 5 mg via ORAL
  Filled 2014-08-30: qty 1

## 2014-08-30 NOTE — ED Notes (Signed)
Patient transported to radiology

## 2014-08-30 NOTE — ED Notes (Signed)
Seen here yesterday for same, c/o back pain ,no relief with prednisone, Vicodin

## 2014-08-30 NOTE — Discharge Instructions (Signed)
Take azithromycin as directed. Follow-up with your primary care physician within 2-3 days. Pneumonia Pneumonia is an infection of the lungs.  CAUSES Pneumonia may be caused by bacteria or a virus. Usually, these infections are caused by breathing infectious particles into the lungs (respiratory tract). SIGNS AND SYMPTOMS   Cough.  Fever.  Chest pain.  Increased rate of breathing.  Wheezing.  Mucus production. DIAGNOSIS  If you have the common symptoms of pneumonia, your health care provider will typically confirm the diagnosis with a chest X-ray. The X-ray will show an abnormality in the lung (pulmonary infiltrate) if you have pneumonia. Other tests of your blood, urine, or sputum may be done to find the specific cause of your pneumonia. Your health care provider may also do tests (blood gases or pulse oximetry) to see how well your lungs are working. TREATMENT  Some forms of pneumonia may be spread to other people when you cough or sneeze. You may be asked to wear a mask before and during your exam. Pneumonia that is caused by bacteria is treated with antibiotic medicine. Pneumonia that is caused by the influenza virus may be treated with an antiviral medicine. Most other viral infections must run their course. These infections will not respond to antibiotics.  HOME CARE INSTRUCTIONS   Cough suppressants may be used if you are losing too much rest. However, coughing protects you by clearing your lungs. You should avoid using cough suppressants if you can.  Your health care provider may have prescribed medicine if he or she thinks your pneumonia is caused by bacteria or influenza. Finish your medicine even if you start to feel better.  Your health care provider may also prescribe an expectorant. This loosens the mucus to be coughed up.  Take medicines only as directed by your health care provider.  Do not smoke. Smoking is a common cause of bronchitis and can contribute to pneumonia.  If you are a smoker and continue to smoke, your cough may last several weeks after your pneumonia has cleared.  A cold steam vaporizer or humidifier in your room or home may help loosen mucus.  Coughing is often worse at night. Sleeping in a semi-upright position in a recliner or using a couple pillows under your head will help with this.  Get rest as you feel it is needed. Your body will usually let you know when you need to rest. PREVENTION A pneumococcal shot (vaccine) is available to prevent a common bacterial cause of pneumonia. This is usually suggested for:  People over 80 years old.  Patients on chemotherapy.  People with chronic lung problems, such as bronchitis or emphysema.  People with immune system problems. If you are over 65 or have a high risk condition, you may receive the pneumococcal vaccine if you have not received it before. In some countries, a routine influenza vaccine is also recommended. This vaccine can help prevent some cases of pneumonia.You may be offered the influenza vaccine as part of your care. If you smoke, it is time to quit. You may receive instructions on how to stop smoking. Your health care provider can provide medicines and counseling to help you quit. SEEK MEDICAL CARE IF: You have a fever. SEEK IMMEDIATE MEDICAL CARE IF:   Your illness becomes worse. This is especially true if you are elderly or weakened from any other disease.  You cannot control your cough with suppressants and are losing sleep.  You begin coughing up blood.  You develop pain which  is getting worse or is uncontrolled with medicines.  Any of the symptoms which initially brought you in for treatment are getting worse rather than better.  You develop shortness of breath or chest pain. MAKE SURE YOU:   Understand these instructions.  Will watch your condition.  Will get help right away if you are not doing well or get worse. Document Released: 04/21/2005 Document  Revised: 09/05/2013 Document Reviewed: 07/11/2010 [facility] Patient Information 2015 [place], [place]. This information is not intended to replace advice given to you by your health care provider. Make sure you discuss any questions you have with your health care provider.  Back Pain, Adult Low back pain is very common. About 1 in 5 people have back pain.The cause of low back pain is rarely dangerous. The pain often gets better over time.About half of people with a sudden onset of back pain feel better in just 2 weeks. About 8 in 10 people feel better by 6 weeks.  CAUSES Some common causes of back pain include:  Strain of the muscles or ligaments supporting the spine.  Wear and tear (degeneration) of the spinal discs.  Arthritis.  Direct injury to the back. DIAGNOSIS Most of the time, the direct cause of low back pain is not known.However, back pain can be treated effectively even when the exact cause of the pain is unknown.Answering your caregiver's questions about your overall health and symptoms is one of the most accurate ways to make sure the cause of your pain is not dangerous. If your caregiver needs more information, he or she may order lab work or imaging tests (X-rays or MRIs).However, even if imaging tests show changes in your back, this usually does not require surgery. HOME CARE INSTRUCTIONS For many people, back pain returns.Since low back pain is rarely dangerous, it is often a condition that people can learn to [facility] their own.   Remain active. It is stressful on the back to sit or stand in one place. Do not sit, drive, or stand in one place for more than 30 minutes at a time. Take short walks on level surfaces as soon as pain allows.Try to increase the length of time you walk each day.  Do not stay in bed.Resting more than 1 or 2 days can delay your recovery.  Do not avoid exercise or work.Your body is made to move.It is not dangerous to be active, even though  your back may hurt.Your back will likely heal faster if you return to being active before your pain is gone.  Pay attention to your body when you bend and lift. Many people have less discomfortwhen lifting if they bend their knees, keep the load close to their bodies,and avoid twisting. Often, the most comfortable positions are those that put less stress on your recovering back.  Find a comfortable position to sleep. Use a firm mattress and lie on your side with your knees slightly bent. If you lie on your back, put a pillow under your knees.  Only take over-the-counter or prescription medicines as directed by your caregiver. Over-the-counter medicines to reduce pain and inflammation are often the most helpful.Your caregiver may prescribe muscle relaxant drugs.These medicines help dull your pain so you can more quickly return to your normal activities and healthy exercise.  Put ice on the injured area.  Put ice in a plastic bag.  Place a towel between your skin and the bag.  Leave the ice on for 15-20 minutes, 03-04 times a day for the  first 2 to 3 days. After that, ice and heat may be alternated to reduce pain and spasms.  Ask your caregiver about trying back exercises and gentle massage. This may be of some benefit.  Avoid feeling anxious or stressed.Stress increases muscle tension and can worsen back pain.It is important to recognize when you are anxious or stressed and learn ways to manage it.Exercise is a great option. SEEK MEDICAL CARE IF:  You have pain that is not relieved with rest or medicine.  You have pain that does not improve in 1 week.  You have new symptoms.  You are generally not feeling well. SEEK IMMEDIATE MEDICAL CARE IF:   You have pain that radiates from your back into your legs.  You develop new bowel or bladder control problems.  You have unusual weakness or numbness in your arms or legs.  You develop nausea or vomiting.  You develop abdominal  pain.  You feel faint. Document Released: 04/21/2005 Document Revised: 10/21/2011 Document Reviewed: 08/23/2013 [facility] Patient Information 2015 [place], [place]. This information is not intended to replace advice given to you by your health care provider. Make sure you discuss any questions you have with your health care provider.

## 2014-08-30 NOTE — ED Provider Notes (Signed)
CSN: [id number]     Arrival date & time 08/30/14  1532 History   First MD Initiated Contact with Patient 08/30/14 1537     Chief Complaint  Patient presents with  . Back Pain     (Consider location/radiation/quality/duration/timing/severity/associated sxs/prior Treatment) HPI Comments: 49 year old female with a past medical history of chronic low back pain presenting with continued right upper back pain x 3 days. No known injury or trauma. She was seen in the ED yesterday, given fentanyl which significantly improved her symptoms while in the emergency department. She was discharged home with a prescription for hydrocodone, however could not get this filled due to her having an ongoing prescription with her PCP for the same. Yesterday, her PCP called her in prednisone which is not helping. She describes the pain as sharp and spasm like, worse with certain movements, palpation or taking a deep breath in. Denies pain, numbness or tingling radiating down her extremities. Denies loss of control bowels or bladder or saddle anesthesia. Denies fever or chills. Denies shortness of breath. No recent trauma or surgery. No exogenous estrogen. Workup negative yesterday in ED. States this is different than her "typical" back pain.  The history is provided by the patient.    History reviewed. No pertinent past medical history. Past Surgical History  Procedure Laterality Date  . Gastric bypass    . Abdominal hysterectomy     History reviewed. No pertinent family history. History  Substance Use Topics  . Smoking status: Never Smoker   . Smokeless tobacco: Not on file  . Alcohol Use: No   OB History    No data available     Review of Systems  Musculoskeletal: Positive for back pain.  All other systems reviewed and are negative.     Allergies  Review of patient's allergies indicates no known allergies.  Home Medications   Prior to Admission medications   Medication Sig Start Date End Date  Taking? Authorizing Provider  predniSONE (DELTASONE) 10 MG tablet Take 10 mg by mouth daily with breakfast.   Yes Historical Provider, MD  azithromycin (ZITHROMAX Z-PAK) 250 MG tablet 2 po day one, then 1 daily x 4 days 08/30/14   Kathrynn Speed, PA-C  HYDROcodone-acetaminophen (NORCO) 5-325 MG per tablet Take 1-2 tablets by mouth every 6 (six) hours as needed (for pain). 08/30/14   John Molpus, MD  hydrocortisone cream 1 % Apply to affected area 2 times daily 11/03/13   [name], MD  hydrOXYzine (ATARAX/VISTARIL) 25 MG tablet Take 1 tablet (25 mg total) by mouth every 6 (six) hours. 11/03/13   [name], MD   BP 137/92 mmHg  Pulse 96  Temp(Src) 98.9 F (37.2 C)  Resp 16  SpO2 96% Physical Exam  Constitutional: She is oriented to person, place, and time. She appears well-developed and well-nourished. No distress.  HENT:  Head: Normocephalic and atraumatic.  Mouth/Throat: Oropharynx is clear and moist.  Eyes: Conjunctivae and EOM are normal. Pupils are equal, round, and reactive to light.  Neck: Normal range of motion. Neck supple. No JVD present. No spinous process tenderness and no muscular tenderness present.  Cardiovascular: Normal rate, regular rhythm, normal heart sounds and intact distal pulses.   No extremity edema.  Pulmonary/Chest: Effort normal and breath sounds normal. No respiratory distress.  Abdominal: Soft. Bowel sounds are normal. There is no tenderness. There is no CVA tenderness.  Musculoskeletal: Normal range of motion. She exhibits no edema.       Thoracic back:  She exhibits tenderness.       Back:  Neurological: She is alert and oriented to person, place, and time. She has normal strength. No sensory deficit.  Strength lower extremities 5/5 and equal bilateral. Sensation intact. Normal gait.  Skin: Skin is warm and dry. No rash noted. She is not diaphoretic.  Psychiatric: She has a normal mood and affect. Her behavior is normal.  Nursing note and vitals  reviewed.   ED Course  Procedures (including critical care time) Labs Review Labs Reviewed  URINALYSIS, ROUTINE W REFLEX MICROSCOPIC - Abnormal; Notable for the following:    Color, Urine AMBER (*)    Specific Gravity, Urine 1.034 (*)    Hgb urine dipstick SMALL (*)    Bilirubin Urine SMALL (*)    Ketones, ur 15 (*)    All other components within normal limits  URINE MICROSCOPIC-ADD ON - Abnormal; Notable for the following:    Squamous Epithelial / LPF FEW (*)    Bacteria, UA FEW (*)    Casts HYALINE CASTS (*)    Crystals CA OXALATE CRYSTALS (*)    All other components within normal limits    Imaging Review Dg Chest 2 View  08/30/2014   CLINICAL DATA:  Intermittent right-sided chest pain and shortness of breath, acute onset. Initial encounter.  EXAM: CHEST  2 VIEW  COMPARISON:  Chest radiograph performed earlier today at 4:23 p.m.  FINDINGS: The lungs are well-aerated. Mild left basilar atelectasis is noted. There is no evidence of pleural effusion or pneumothorax.  The heart is borderline enlarged. No acute osseous abnormalities are seen.  IMPRESSION: Mild left basilar atelectasis noted; borderline cardiomegaly.   Electronically Signed   By: Roanna Raider M.D.   On: 08/30/2014 00:28   Ct Renal Stone Study  08/30/2014   CLINICAL DATA:  Right flank pain.  Hematuria.  Pain began yesterday.  EXAM: CT ABDOMEN AND PELVIS WITHOUT CONTRAST  TECHNIQUE: Multidetector CT imaging of the abdomen and pelvis was performed following the standard protocol without IV contrast.  COMPARISON:  03/08/2013  FINDINGS: Opacities in the right middle lobe and left lung base are most consistent with atelectasis. There slightly greater opacity in the basilar right lower lobe, also most likely atelectasis although infection is not excluded.  The liver, gallbladder, spleen, adrenal glands, and pancreas are unremarkable. The kidneys are unremarkable without evidence of renal calculi or hydronephrosis. The ureters are  nondilated, and no definite ureteral calculi are identified.  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 There is no evidence of bowel obstruction. Sigmoid and descending colonic diverticulosis is noted without evidence of diverticulitis. Appendix is unremarkable.  Bladder is decompressed. Sequelae of interval hysterectomy are identified. No free fluid or enlarged lymph nodes are identified. No acute osseous abnormality is identified.  IMPRESSION: 1. No evidence of urinary tract calculi or obstruction. 2. Diverticulosis. 3. Bibasilar atelectasis. Right lower lobe infection not completely excluded.   Electronically Signed   By: Sebastian Ache   On: 08/30/2014 17:11     EKG Interpretation None      MDM   Final diagnoses:  Back pain  Right-sided thoracic back pain   Patient was getting back to the ED after being seen yesterday for upper back pain. Nontoxic appearing, NAD. Afebrile. Vital signs stable. Right-sided mid to upper back pain reproducible. She had a negative chest pain workup yesterday in the ED. Urinalysis was not obtained. UA obtained today, positive for small amount of blood along with calcium oxalate  crystals and hyaline casts. Pain may possibly be coming from a kidney stone. Plan to obtain CT for renal stone study. Doubt PE. PERC negative and low risk. No red flags concerning patient's back pain. No s/s of central cord compression or cauda equina. Lower extremities are neurovascularly intact and patient is ambulating without difficulty.  5:44 PM CT negative for stone, however bibasilar atelectasis noted with right lower lobe infection unable to be excluded. Given patient's back pain being right-sided, and yesterday having chest pain, will treat with azithromycin. Patient reports she has recently had a cough that feels mucus-like, however she is unable to cough it up. Stable for discharge. Advised follow-up with PCP within 2-3 days. Stable for discharge. Return precautions  given. Patient states understanding of treatment care plan and is agreeable.  Kathrynn Speed, PA-C 08/30/14 1745  Gwyneth Sprout, MD 08/30/14 [id number]

## 2014-10-29 ENCOUNTER — Emergency Department (HOSPITAL_BASED_OUTPATIENT_CLINIC_OR_DEPARTMENT_OTHER)
Admission: EM | Admit: 2014-10-29 | Discharge: 2014-10-29 | Disposition: A | Discharge status: Home / Self Care | Payer: Medicare HMO | Attending: Emergency Medicine | Admitting: Emergency Medicine

## 2014-10-29 ENCOUNTER — Encounter (HOSPITAL_BASED_OUTPATIENT_CLINIC_OR_DEPARTMENT_OTHER): Payer: Self-pay | Admitting: Emergency Medicine

## 2014-10-29 DIAGNOSIS — H9203 Otalgia, bilateral: Secondary | ICD-10-CM | POA: Diagnosis not present

## 2014-10-29 DIAGNOSIS — J0100 Acute maxillary sinusitis, unspecified: Secondary | ICD-10-CM | POA: Diagnosis not present

## 2014-10-29 DIAGNOSIS — Z7952 Long term (current) use of systemic steroids: Secondary | ICD-10-CM | POA: Diagnosis not present

## 2014-10-29 DIAGNOSIS — R0981 Nasal congestion: Secondary | ICD-10-CM | POA: Diagnosis present

## 2014-10-29 DIAGNOSIS — Z79899 Other long term (current) drug therapy: Secondary | ICD-10-CM | POA: Diagnosis not present

## 2014-10-29 NOTE — ED Notes (Signed)
Pt not in bed at this time for assessment or medical care will attempt to reassess

## 2014-10-29 NOTE — ED Notes (Signed)
Pt upset about delay in care, asking if she had been "thrown in the corner and forgotten" - reassured patient that she had not been forgotten. Reminded EDP of patients presence.

## 2014-10-29 NOTE — ED Provider Notes (Signed)
CSN: [id number]     Arrival date & time 10/29/14  1859 History   First MD Initiated Contact with Patient 10/29/14 2132     Chief Complaint  Patient presents with  . Otalgia  . Nasal Congestion     Patient is a 49 y.o. female presenting with ear pain. The history is provided by the patient. No language interpreter was used.  Otalgia  [name] presents for evaluation of sinus and nasal congestion. She developed her symptoms about a week ago. She reports popping in bilateral ears, nasal congestion, difficulty breathing through her nose. She has pain behind her eyes. She denies any fevers. She saw her PCP 1 week ago and was given a shot of penicillin and antibiotics. She went to urgent care shortly thereafter because she wasn't responding to treatment and she was started on a different antibiotic that she does not know the name of. Symptoms are moderate, constant, worsening.  History reviewed. No pertinent past medical history. Past Surgical History  Procedure Laterality Date  . Gastric bypass    . Abdominal hysterectomy     History reviewed. No pertinent family history. History  Substance Use Topics  . Smoking status: Never Smoker   . Smokeless tobacco: Not on file  . Alcohol Use: No   OB History    No data available     Review of Systems  HENT: Positive for ear pain.   All other systems reviewed and are negative.     Allergies  Review of patient's allergies indicates no known allergies.  Home Medications   Prior to Admission medications   Medication Sig Start Date End Date Taking? Authorizing Provider  azithromycin (ZITHROMAX Z-PAK) 250 MG tablet 2 po day one, then 1 daily x 4 days 08/30/14   Kathrynn Speed, PA-C  diazepam (VALIUM) 5 MG tablet Take 1 tablet (5 mg total) by mouth every 12 (twelve) hours as needed for muscle spasms. 08/30/14   Kathrynn Speed, PA-C  HYDROcodone-acetaminophen (NORCO) 5-325 MG per tablet Take 1-2 tablets by mouth every 6 (six) hours as needed  (for pain). 08/30/14   John Molpus, MD  hydrocortisone cream 1 % Apply to affected area 2 times daily 11/03/13   [name], MD  hydrOXYzine (ATARAX/VISTARIL) 25 MG tablet Take 1 tablet (25 mg total) by mouth every 6 (six) hours. 11/03/13   [name], MD  predniSONE (DELTASONE) 10 MG tablet Take 10 mg by mouth daily with breakfast.    Historical Provider, MD   BP 122/82 mmHg  Pulse 97  Temp(Src) 98.1 F (36.7 C) (Oral)  Resp 18  Ht 5' 5.5" (1.664 m)  Wt 197 lb (89.359 kg)  BMI 32.27 kg/m2  SpO2 99% Physical Exam  Constitutional: She is oriented to person, place, and time. She appears well-developed and well-nourished. No distress.  HENT:  Head: Normocephalic and atraumatic.  Left TM retracted without any erythema, right TM is normal. Oropharynx is clear. Nasal turbinates are erythematous and boggy  Eyes: EOM are normal. Pupils are equal, round, and reactive to light.  Cardiovascular: Normal rate and regular rhythm.   No murmur heard. Pulmonary/Chest: Effort normal and breath sounds normal. No respiratory distress.  Musculoskeletal: She exhibits no edema.  Neurological: She is alert and oriented to person, place, and time.  Skin: Skin is warm and dry.  Psychiatric: She has a normal mood and affect. Her behavior is normal.  Nursing note and vitals reviewed.   ED Course  Procedures (including critical care  time) Labs Review Labs Reviewed - No data to display  Imaging Review No results found.   EKG Interpretation None      MDM   Final diagnoses:  None   patient here for evaluation of nasal congestion. She is nontoxic and well-hydrated on examination. She has been on 2 antibiotics and last week but she does not know the names of either of them. There is no evidence of acute bacterial fact infection at this time. Discussed with patient recommendation of optimizing sinus drainage with Afrin and decongestants with outpatient follow-up.   Tilden Fossa, MD 10/29/14  2208

## 2014-10-29 NOTE — Discharge Instructions (Signed)
Take Afrin, available over the counter, twice daily for the next three days.  Take sudafed OR mucinex, available over the counter (from the pharmacist) for the next week according to label instructions.    Sinusitis Sinusitis is redness, soreness, and inflammation of the paranasal sinuses. Paranasal sinuses are air pockets within the bones of your face (beneath the eyes, the middle of the forehead, or above the eyes). In healthy paranasal sinuses, mucus is able to drain out, and air is able to circulate through them by way of your nose. However, when your paranasal sinuses are inflamed, mucus and air can become trapped. This can allow bacteria and other germs to grow and cause infection. Sinusitis can develop quickly and last only a short time (acute) or continue over a long period (chronic). Sinusitis that lasts for more than 12 weeks is considered chronic.  CAUSES  Causes of sinusitis include:  Allergies.  Structural abnormalities, such as displacement of the cartilage that separates your nostrils (deviated septum), which can decrease the air flow through your nose and sinuses and affect sinus drainage.  Functional abnormalities, such as when the small hairs (cilia) that line your sinuses and help remove mucus do not work properly or are not present. SIGNS AND SYMPTOMS  Symptoms of acute and chronic sinusitis are the same. The primary symptoms are pain and pressure around the affected sinuses. Other symptoms include:  Upper toothache.  Earache.  Headache.  Bad breath.  Decreased sense of smell and taste.  A cough, which worsens when you are lying flat.  Fatigue.  Fever.  Thick drainage from your nose, which often is green and may contain pus (purulent).  Swelling and warmth over the affected sinuses. DIAGNOSIS  Your health care provider will perform a physical exam. During the exam, your health care provider may:  Look in your nose for signs of abnormal growths in your  nostrils (nasal polyps).  Tap over the affected sinus to check for signs of infection.  View the inside of your sinuses (endoscopy) using an imaging device that has a light attached (endoscope). If your health care provider suspects that you have chronic sinusitis, one or more of the following tests may be recommended:  Allergy tests.  Nasal culture. A sample of mucus is taken from your nose, sent to a lab, and screened for bacteria.  Nasal cytology. A sample of mucus is taken from your nose and examined by your health care provider to determine if your sinusitis is related to an allergy. TREATMENT  Most cases of acute sinusitis are related to a viral infection and will resolve on their own within 10 days. Sometimes medicines are prescribed to help relieve symptoms (pain medicine, decongestants, nasal steroid sprays, or saline sprays).  However, for sinusitis related to a bacterial infection, your health care provider will prescribe antibiotic medicines. These are medicines that will help kill the bacteria causing the infection.  Rarely, sinusitis is caused by a fungal infection. In theses cases, your health care provider will prescribe antifungal medicine. For some cases of chronic sinusitis, surgery is needed. Generally, these are cases in which sinusitis recurs more than 3 times per year, despite other treatments. HOME CARE INSTRUCTIONS   Drink plenty of water. Water helps thin the mucus so your sinuses can drain more easily.  Use a humidifier.  Inhale steam 3 to 4 times a day (for example, sit in the bathroom with the shower running).  Apply a warm, moist washcloth to your face 3 to  4 times a day, or as directed by your health care provider.  Use saline nasal sprays to help moisten and clean your sinuses.  Take medicines only as directed by your health care provider.  If you were prescribed either an antibiotic or antifungal medicine, finish it all even if you start to feel  better. SEEK IMMEDIATE MEDICAL CARE IF:  You have increasing pain or severe headaches.  You have nausea, vomiting, or drowsiness.  You have swelling around your face.  You have vision problems.  You have a stiff neck.  You have difficulty breathing. MAKE SURE YOU:   Understand these instructions.  Will watch your condition.  Will get help right away if you are not doing well or get worse. Document Released: 04/21/2005 Document Revised: 09/05/2013 Document Reviewed: 05/06/2011 [facility] Patient Information 2015 [place], [place]. This information is not intended to replace advice given to you by your health care provider. Make sure you discuss any questions you have with your health care provider.

## 2014-10-29 NOTE — ED Notes (Signed)
Pt walked out front door after speaking with EDP. Unable to speak with pt, obtain VS or review d/c instructions.

## 2014-10-29 NOTE — ED Notes (Signed)
Pt in c/o otalgia and nasal congestion. Was seen by primary care 6 days ago and given abx for sinus infection. States no improvement since then.

## 2014-10-29 NOTE — ED Notes (Signed)
Pt left department prior to receiving discharge papers

## 2015-06-06 ENCOUNTER — Emergency Department (HOSPITAL_BASED_OUTPATIENT_CLINIC_OR_DEPARTMENT_OTHER)
Admission: EM | Admit: 2015-06-06 | Discharge: 2015-06-06 | Disposition: A | Discharge status: Home / Self Care | Payer: Medicare HMO | Attending: Emergency Medicine | Admitting: Emergency Medicine

## 2015-06-06 ENCOUNTER — Encounter (HOSPITAL_BASED_OUTPATIENT_CLINIC_OR_DEPARTMENT_OTHER): Payer: Self-pay | Admitting: Emergency Medicine

## 2015-06-06 ENCOUNTER — Emergency Department (HOSPITAL_BASED_OUTPATIENT_CLINIC_OR_DEPARTMENT_OTHER): Payer: Medicare HMO

## 2015-06-06 DIAGNOSIS — Z7952 Long term (current) use of systemic steroids: Secondary | ICD-10-CM | POA: Insufficient documentation

## 2015-06-06 DIAGNOSIS — R05 Cough: Secondary | ICD-10-CM | POA: Diagnosis present

## 2015-06-06 DIAGNOSIS — J159 Unspecified bacterial pneumonia: Secondary | ICD-10-CM | POA: Diagnosis not present

## 2015-06-06 DIAGNOSIS — J189 Pneumonia, unspecified organism: Secondary | ICD-10-CM

## 2015-06-06 LAB — RAPID STREP SCREEN (MED CTR MEBANE ONLY): Streptococcus, Group A Screen (Direct): NEGATIVE

## 2015-06-06 MED ORDER — LEVOFLOXACIN 750 MG PO TABS
750.0000 mg | ORAL_TABLET | Freq: Once | ORAL | Status: AC
  Administered 2015-06-06: 750 mg via ORAL
  Filled 2015-06-06: qty 1

## 2015-06-06 MED ORDER — BENZONATATE 100 MG PO CAPS: 100.0000 mg | ORAL_CAPSULE | Freq: Three times a day (TID) | ORAL | Status: DC

## 2015-06-06 MED ORDER — ONDANSETRON 4 MG PO TBDP: 4.0000 mg | ORAL_TABLET | Freq: Three times a day (TID) | ORAL | Status: DC | PRN

## 2015-06-06 MED ORDER — IBUPROFEN 800 MG PO TABS
800.0000 mg | ORAL_TABLET | Freq: Once | ORAL | Status: AC
  Administered 2015-06-06: 800 mg via ORAL
  Filled 2015-06-06: qty 1

## 2015-06-06 MED ORDER — LEVOFLOXACIN 500 MG PO TABS: 500.0000 mg | ORAL_TABLET | Freq: Every day | ORAL | Status: DC

## 2015-06-06 MED ORDER — HYDROCODONE-ACETAMINOPHEN 5-325 MG PO TABS
2.0000 | ORAL_TABLET | Freq: Once | ORAL | Status: AC
  Administered 2015-06-06: 2 via ORAL
  Filled 2015-06-06: qty 2

## 2015-06-06 NOTE — ED Notes (Signed)
Patient with complaints of sore throat

## 2015-06-06 NOTE — ED Provider Notes (Addendum)
CSN: [id number]     Arrival date & time 06/06/15  1718 History  By signing my name below, I, Soijett Blue, attest that this documentation has been prepared under the direction and in the presence of Rolland Porter, MD. Electronically Signed: Soijett Blue, ED Scribe. 06/06/2015. 6:21 PM.   Chief Complaint  Patient presents with  . Cough      The history is provided by the patient.    [name] Steele is a 50 y.o. female who presents to the Emergency Department complaining of productive cough onset last night. She reports that he symptoms began with a cough and when she lays down she has difficulty breathing. She notes that she had an outpatient breast reduction surgery prior to the onset of her symptoms. She reports that she has contacted her surgeon who informed her to come to the ED for further evaluation. Pt has sick contacts of her grandson. Pt reports that she didn't get the flu vaccine this past year. She states that she is having associated symptoms of sore throat, HA, and nausea. She states that she has tried Rx hydrocodone with no relief for her symptoms. She denies vomiting and any other symptoms. Pt denies allergies to any medications at this time.    History reviewed. No pertinent past medical history. Past Surgical History  Procedure Laterality Date  . Gastric bypass    . Abdominal hysterectomy    . Breast reduction surgery     History reviewed. No pertinent family history. Social History  Substance Use Topics  . Smoking status: Never Smoker   . Smokeless tobacco: None  . Alcohol Use: No   OB History    No data available     Review of Systems  Constitutional: Negative for fever, chills, diaphoresis, appetite change and fatigue.  HENT: Positive for sore throat. Negative for mouth sores and trouble swallowing.   Eyes: Negative for visual disturbance.  Respiratory: Positive for cough. Negative for chest tightness, shortness of breath and wheezing.   Cardiovascular:  Negative for chest pain.  Gastrointestinal: Negative for nausea, vomiting, abdominal pain, diarrhea and abdominal distention.  Endocrine: Negative for polydipsia, polyphagia and polyuria.  Genitourinary: Negative for dysuria, frequency and hematuria.  Musculoskeletal: Negative for gait problem.  Skin: Negative for color change, pallor and rash.  Neurological: Positive for headaches. Negative for dizziness, syncope and light-headedness.  Hematological: Does not bruise/bleed easily.  Psychiatric/Behavioral: Negative for behavioral problems and confusion.      Allergies  Review of patient's allergies indicates no known allergies.  Home Medications   Prior to Admission medications   Medication Sig Start Date End Date Taking? Authorizing Provider  azithromycin (ZITHROMAX Z-PAK) 250 MG tablet 2 po day one, then 1 daily x 4 days 08/30/14   Kathrynn Speed, PA-C  benzonatate (TESSALON) 100 MG capsule Take 1 capsule (100 mg total) by mouth every 8 (eight) hours. 06/06/15   Rolland Porter, MD  diazepam (VALIUM) 5 MG tablet Take 1 tablet (5 mg total) by mouth every 12 (twelve) hours as needed for muscle spasms. 08/30/14   Kathrynn Speed, PA-C  HYDROcodone-acetaminophen (NORCO) 5-325 MG per tablet Take 1-2 tablets by mouth every 6 (six) hours as needed (for pain). 08/30/14   John Molpus, MD  hydrocortisone cream 1 % Apply to affected area 2 times daily 11/03/13   [name], MD  hydrOXYzine (ATARAX/VISTARIL) 25 MG tablet Take 1 tablet (25 mg total) by mouth every 6 (six) hours. 11/03/13   [name], MD  levofloxacin (LEVAQUIN) 500 MG tablet Take 1 tablet (500 mg total) by mouth daily. 06/06/15   Rolland Porter, MD  ondansetron (ZOFRAN ODT) 4 MG disintegrating tablet Take 1 tablet (4 mg total) by mouth every 8 (eight) hours as needed for nausea. 06/06/15   Rolland Porter, MD  predniSONE (DELTASONE) 10 MG tablet Take 10 mg by mouth daily with breakfast.    Historical Provider, MD   BP 148/95 mmHg  Pulse 115   Temp(Src) 100 F (37.8 C) (Oral)  Resp 18  Ht 5\' 6"  (1.676 m)  Wt 200 lb (90.719 kg)  BMI 32.30 kg/m2  SpO2 95% Physical Exam  Constitutional: She is oriented to person, place, and time. She appears well-developed and well-nourished. No distress.  HENT:  Head: Normocephalic.  Mouth/Throat: Uvula is midline and mucous membranes are normal. Posterior oropharyngeal erythema present. No oropharyngeal exudate or posterior oropharyngeal edema.  Eyes: Conjunctivae are normal. Pupils are equal, round, and reactive to light. No scleral icterus.  Neck: Normal range of motion. Neck supple. No thyromegaly present.  Cardiovascular: Normal rate and regular rhythm.  Exam reveals no gallop and no friction rub.   No murmur heard. Pulmonary/Chest: Effort normal. No respiratory distress. She has decreased breath sounds. She has no wheezes. She has no rales.  diminished breath sounds left worse than right.  Abdominal: Soft. Bowel sounds are normal. She exhibits no distension. There is no tenderness. There is no rebound.  Musculoskeletal: Normal range of motion.  Neurological: She is alert and oriented to person, place, and time.  Skin: Skin is warm and dry. No rash noted.  Psychiatric: She has a normal mood and affect. Her behavior is normal.  Nursing note and vitals reviewed.   ED Course  Procedures (including critical care time) DIAGNOSTIC STUDIES: Oxygen Saturation is 95% on RA, adequate by my interpretation.    COORDINATION OF CARE: 6:19 PM Discussed treatment plan with pt at bedside which includes rapid strep screen and culture, CXR, and pt agreed to plan.    Labs Review Labs Reviewed  RAPID STREP SCREEN (NOT AT [facility])  CULTURE, GROUP A STREP [facility])    Imaging Review Dg Chest 2 View  06/06/2015  CLINICAL DATA:  Cough and fever. Two days post breast reduction surgery. EXAM: CHEST  2 VIEW COMPARISON:  08/29/2014 chest radiograph. FINDINGS: Soft tissue gas is seen in the bilateral breast soft  tissues. Stable cardiomediastinal silhouette with normal heart size. No pneumothorax. No pleural effusion. There is mild atelectasis at the left lung base. There is increased retrosternal density on the lateral view. IMPRESSION: 1. Increased retrosternal density on the lateral view, cannot exclude an upper lobe pneumonia, although this could be artifactual due to overlying soft tissue swelling/gas related to the breast surgery. Recommend follow-up PA and lateral post treatment chest radiographs in 4 weeks. 2. Mild left basilar atelectasis. Electronically Signed   By: Delbert Phenix M.D.   On: 06/06/2015 18:21   I have personally reviewed and evaluated these images and lab results as part of my medical decision-making.   EKG Interpretation None      MDM   Final diagnoses:  Healthcare-associated pneumonia   Patient presents here with her 25 year old child who is also febrile. Child does not have obvious pneumonia on exam or x-ray. Neither were immunized for influenza. Very likely started as viral process. Her exam suggest pneumonia.  Patient is febrile 102.7. Exam consistent with crackles in the left midlung. Chest x-ray on lateral shows retrosternal density. Constellation of  findings consistent with kidney require possibly hospital-acquired pneumonia. She did have her surgery and an outpatient surgical center however not an inpatient setting. There is some percent saturations. She is having surprisingly little pain from her surgery does not have any pleuritic discomfort. Given Levaquin, Zofran, Vicodin, ibuprofen. Plan will be home Levaquin symptomatically treatment. Recheck with any worsening symptoms  Rolland Porter, MD 06/06/15 1830  Rolland Porter, MD 06/06/15 [phone]

## 2015-06-06 NOTE — ED Notes (Signed)
Patient states that she had a breast reduction 2 days ago. Last night she started to have a cough and fever. The patient is here with grandson who has similar symptoms

## 2015-06-06 NOTE — Discharge Instructions (Signed)

## 2015-06-09 LAB — CULTURE, GROUP A STREP (THRC)

## 2016-03-23 ENCOUNTER — Encounter (HOSPITAL_BASED_OUTPATIENT_CLINIC_OR_DEPARTMENT_OTHER): Payer: Self-pay | Admitting: Emergency Medicine

## 2016-03-23 ENCOUNTER — Emergency Department (HOSPITAL_BASED_OUTPATIENT_CLINIC_OR_DEPARTMENT_OTHER)
Admission: EM | Admit: 2016-03-23 | Discharge: 2016-03-23 | Disposition: A | Discharge status: Home / Self Care | Payer: Medicare HMO | Attending: Emergency Medicine | Admitting: Emergency Medicine

## 2016-03-23 DIAGNOSIS — J060 Acute laryngopharyngitis: Secondary | ICD-10-CM | POA: Diagnosis not present

## 2016-03-23 DIAGNOSIS — R0981 Nasal congestion: Secondary | ICD-10-CM | POA: Diagnosis present

## 2016-03-23 DIAGNOSIS — J069 Acute upper respiratory infection, unspecified: Secondary | ICD-10-CM | POA: Diagnosis not present

## 2016-03-23 DIAGNOSIS — Z79899 Other long term (current) drug therapy: Secondary | ICD-10-CM | POA: Diagnosis not present

## 2016-03-23 LAB — RAPID STREP SCREEN (MED CTR MEBANE ONLY): STREPTOCOCCUS, GROUP A SCREEN (DIRECT): NEGATIVE

## 2016-03-23 MED ORDER — DEXAMETHASONE 6 MG PO TABS
10.0000 mg | ORAL_TABLET | Freq: Once | ORAL | Status: AC
  Administered 2016-03-23: 10 mg via ORAL
  Filled 2016-03-23: qty 1

## 2016-03-23 NOTE — ED Triage Notes (Signed)
Patient reports sore throat since Thursday.  Reports fever, headaches.  Denies vomiting.

## 2016-03-23 NOTE — ED Provider Notes (Signed)
[facility] Note   CSN: [id number] Arrival date & time: 03/23/16  1810  By signing my name below, I, Arianna Nassar, attest that this documentation has been prepared under the direction and in the presence of Alvira Monday, MD.  Electronically Signed: Octavia Heir, ED Scribe. 03/23/16. 7:46 PM.    History   Chief Complaint Chief Complaint  Patient presents with  . Sore Throat   The history is provided by the patient. No language interpreter was used.   HPI Comments: [name] Steele is a 50 y.o. female who presents to the Emergency Department complaining of constant, gradual worsening, sore throat that started 4 days ago. Associated pain with swallowing, nasal congestion and cough which produces a headache. Pt began to lose her throat ~ 3 days ago. She says her symptoms are worse when she lays down. Pt has been drinking warm fluids to relieve her pain with temporary relief. She has also been taking OTC sinus and allergy medication to alleviate her symptoms without relief. She has not been around any sick contacts. Denies fever, shortness of breath, nausea, vomiting, dysuria, hematuria, urinary symptoms, back pain.  PCP: LONG,ASHLEY B, PA-C   History reviewed. No pertinent past medical history.  There are no active problems to display for this patient.   Past Surgical History:  Procedure Laterality Date  . ABDOMINAL HYSTERECTOMY    . BREAST REDUCTION SURGERY    . GASTRIC BYPASS      OB History    No data available       Home Medications    Prior to Admission medications   Medication Sig Start Date End Date Taking? Authorizing Provider  HYDROCHLOROTHIAZIDE PO Take by mouth.   Yes Historical Provider, MD  azithromycin (ZITHROMAX Z-PAK) 250 MG tablet 2 po day one, then 1 daily x 4 days 08/30/14   Kathrynn Speed, PA-C  benzonatate (TESSALON) 100 MG capsule Take 1 capsule (100 mg total) by mouth every 8 (eight) hours. 06/06/15   Rolland Porter, MD    diazepam (VALIUM) 5 MG tablet Take 1 tablet (5 mg total) by mouth every 12 (twelve) hours as needed for muscle spasms. 08/30/14   Kathrynn Speed, PA-C  HYDROcodone-acetaminophen (NORCO) 5-325 MG per tablet Take 1-2 tablets by mouth every 6 (six) hours as needed (for pain). 08/30/14   John Molpus, MD  hydrocortisone cream 1 % Apply to affected area 2 times daily 11/03/13   [name], MD  hydrOXYzine (ATARAX/VISTARIL) 25 MG tablet Take 1 tablet (25 mg total) by mouth every 6 (six) hours. 11/03/13   [name], MD  levofloxacin (LEVAQUIN) 500 MG tablet Take 1 tablet (500 mg total) by mouth daily. 06/06/15   Rolland Porter, MD  ondansetron (ZOFRAN ODT) 4 MG disintegrating tablet Take 1 tablet (4 mg total) by mouth every 8 (eight) hours as needed for nausea. 06/06/15   Rolland Porter, MD  predniSONE (DELTASONE) 10 MG tablet Take 10 mg by mouth daily with breakfast.    Historical Provider, MD    Family History History reviewed. No pertinent family history.  Social History Social History  Substance Use Topics  . Smoking status: Never Smoker  . Smokeless tobacco: Never Used  . Alcohol use No     Allergies   Patient has no known allergies.   Review of Systems Review of Systems  Constitutional: Negative for fever.  HENT: Positive for congestion and sore throat. Negative for trouble swallowing.   Eyes: Negative for visual disturbance.  Respiratory: Positive for cough. Negative for shortness of breath.   Cardiovascular: Negative for chest pain.  Gastrointestinal: Negative for abdominal pain and vomiting.  Genitourinary: Negative for difficulty urinating, dysuria, frequency, hematuria and urgency.  Musculoskeletal: Negative for back pain and neck pain.  Skin: Negative for rash.  Neurological: Positive for headaches. Negative for syncope.     Physical Exam Updated Vital Signs BP 125/93 (BP Location: Right Arm)   Pulse 91   Temp 98.9 F (37.2 C) (Oral)   Resp 19   Ht 5\' 6"  (1.676 m)   Wt  198 lb (89.8 kg)   SpO2 97%   BMI 31.96 kg/m   Physical Exam  Constitutional: She is oriented to person, place, and time. She appears well-developed and well-nourished. No distress.  HENT:  Head: Normocephalic and atraumatic.  Eyes: EOM are normal.  Neck: Normal range of motion.  Cardiovascular: Normal rate, regular rhythm and normal heart sounds.   Pulmonary/Chest: Effort normal and breath sounds normal.  Abdominal: Soft. She exhibits no distension. There is no tenderness.  Musculoskeletal: Normal range of motion.  Neurological: She is alert and oriented to person, place, and time.  Skin: Skin is warm and dry.  Psychiatric: She has a normal mood and affect. Judgment normal.  Nursing note and vitals reviewed.    ED Treatments / Results  DIAGNOSTIC STUDIES: Oxygen Saturation is 96% on RA, normal by my interpretation.  COORDINATION OF CARE:  7:46 PM Discussed treatment plan with pt at bedside and pt agreed to plan.  Labs (all labs ordered are listed, but only abnormal results are displayed) Labs Reviewed  RAPID STREP SCREEN (NOT AT [facility])  CULTURE, GROUP A STREP [facility]    EKG  EKG Interpretation None       Radiology No results found.  Procedures Procedures (including critical care time)  Medications Ordered in ED Medications  dexamethasone (DECADRON) tablet 10 mg (10 mg Oral Given 03/23/16 2001)     Initial Impression / Assessment and Plan / ED Course  I have reviewed the triage vital signs and the nursing notes.  Pertinent labs & imaging results that were available during my care of the patient were reviewed by me and considered in my medical decision making (see chart for details).  Clinical Course     50yo female presents with concern for sore throat, hoarse voice, congestion.  Given afebrile, duration of symptoms, doubt bacterial sinusitis. No sign of RPA, epiglottitis, PTA and strep negative.  Suspect viral laryngopharyngitis/URI.  Gave decadron and  recommend supportive care.  Patient discharged in stable condition with understanding of reasons to return.   I personally performed the services described in this documentation, which was scribed in my presence. The recorded information has been reviewed and is accurate.  Final Clinical Impressions(s) / ED Diagnoses   Final diagnoses:  Acute laryngopharyngitis  Viral upper respiratory tract infection    New Prescriptions Discharge Medication List as of 03/23/2016  7:54 PM       Alvira MondayErin Frady Taddeo, MD 03/24/16 1459

## 2016-03-27 LAB — CULTURE, GROUP A STREP (THRC)

## 2016-08-25 ENCOUNTER — Encounter (HOSPITAL_BASED_OUTPATIENT_CLINIC_OR_DEPARTMENT_OTHER): Payer: Self-pay | Admitting: Emergency Medicine

## 2016-08-25 ENCOUNTER — Emergency Department (HOSPITAL_BASED_OUTPATIENT_CLINIC_OR_DEPARTMENT_OTHER)
Admission: EM | Admit: 2016-08-25 | Discharge: 2016-08-25 | Disposition: A | Discharge status: Home / Self Care | Payer: Medicare Other | Attending: Emergency Medicine | Admitting: Emergency Medicine

## 2016-08-25 DIAGNOSIS — M542 Cervicalgia: Secondary | ICD-10-CM | POA: Diagnosis present

## 2016-08-25 DIAGNOSIS — M5412 Radiculopathy, cervical region: Secondary | ICD-10-CM | POA: Insufficient documentation

## 2016-08-25 NOTE — ED Triage Notes (Addendum)
Pt states arm pain (entire arm) x more than 1 week that has worsened. Describes pain as aching in shoulder, tingling in upper arm to hand. Also reports receiving "an injection" in her neck for this same arm pain ~ 1 month ago without relief.

## 2016-08-25 NOTE — ED Provider Notes (Signed)
[facility] Provider Note: Lowella Dell, MD, FACEP  CSN: [id number] MRN: [id number] ARRIVAL: 08/25/16 at 0523 ROOM: MH06/[facility]06   CHIEF COMPLAINT  Arm Pain   HISTORY OF PRESENT ILLNESS  [name] Steele is a 51 y.o. female with a history of cervical lumbar radiculopathy. She has had pain in her right neck and arm and about the C7 dermatome for over a month. She had injections in her neck by her pain management doctor in [place] about a month ago. This provided transient but incomplete relief. The pain is worsened over the past 2-3 days. The pain has been severe enough to keep her from sleeping at night; she rates it a 10 out of 10. The pain is dull pain that originates in the right neck and radiates down to her right wrist. There is are paresthesias of the biceps region but no paresthesias of the fingers. She has not noticed any motor weakness but range of motion of the right shoulder is limited due to pain exacerbation. Pain is also worsened by rotation of her head to the right. She states she is currently on hydrocodone and gabapentin which have not been providing adequate relief. She states she is not supposed to take NSAIDs due to gastric bypass.   Consultation with the [facility] state controlled substances database reveals the patient has received no opioid prescriptions in the past year despite the patient's statement that she is prescribed hydrocodone by her pain management doctor in [place].   History reviewed. No pertinent past medical history.  Past Surgical History:  Procedure Laterality Date  . ABDOMINAL HYSTERECTOMY    . BREAST REDUCTION SURGERY    . FOOT SURGERY    . GASTRIC BYPASS    . TUBAL LIGATION      No family history on file.  Social History  Substance Use Topics  . Smoking status: Never Smoker  . Smokeless tobacco: Never Used  . Alcohol use No    Prior to Admission medications   Medication Sig Start Date End Date Taking?  Authorizing Provider  diclofenac sodium (VOLTAREN) 1 % GEL Apply topically 4 (four) times daily.   Yes Historical Provider, MD  azithromycin (ZITHROMAX Z-PAK) 250 MG tablet 2 po day one, then 1 daily x 4 days 08/30/14   Kathrynn Speed, PA-C  benzonatate (TESSALON) 100 MG capsule Take 1 capsule (100 mg total) by mouth every 8 (eight) hours. 06/06/15   Rolland Porter, MD  diazepam (VALIUM) 5 MG tablet Take 1 tablet (5 mg total) by mouth every 12 (twelve) hours as needed for muscle spasms. 08/30/14   Robyn M Hess, PA-C  HYDROCHLOROTHIAZIDE PO Take by mouth.    Historical Provider, MD  HYDROcodone-acetaminophen (NORCO) 5-325 MG per tablet Take 1-2 tablets by mouth every 6 (six) hours as needed (for pain). 08/30/14   Daric Koren, MD  hydrocortisone cream 1 % Apply to affected area 2 times daily 11/03/13   [name], MD  hydrOXYzine (ATARAX/VISTARIL) 25 MG tablet Take 1 tablet (25 mg total) by mouth every 6 (six) hours. 11/03/13   [name], MD  levofloxacin (LEVAQUIN) 500 MG tablet Take 1 tablet (500 mg total) by mouth daily. 06/06/15   Rolland Porter, MD  ondansetron (ZOFRAN ODT) 4 MG disintegrating tablet Take 1 tablet (4 mg total) by mouth every 8 (eight) hours as needed for nausea. 06/06/15   Rolland Porter, MD  predniSONE (DELTASONE) 10 MG tablet Take 10 mg by mouth daily with breakfast.  Historical Provider, MD    Allergies Patient has no known allergies.   REVIEW OF SYSTEMS  Negative except as noted here or in the History of Present Illness.   PHYSICAL EXAMINATION  Initial Vital Signs Blood pressure 133/89, pulse 80, temperature 98.6 F (37 C), temperature source Oral, resp. rate 17, height 5\' 6"  (1.676 m), weight 200 lb (90.7 kg), SpO2 98 %.  Examination General: Well-developed, well-nourished female in no acute distress; appearance consistent with age of record HENT: normocephalic; atraumatic Eyes: pupils equal, round and reactive to light; extraocular muscles intact Neck: supple; rotation of  head to the right exacerbates pain Heart: regular rate and rhythm Lungs: clear to auscultation bilaterally Abdomen: soft; nondistended Extremities: No deformity; full range of motion except abduction of the right shoulder due to pain; pulses normal Neurologic: Awake, alert and oriented; motor function intact in all extremities and symmetric; no facial droop Skin: Warm and dry Psychiatric: Normal mood and affect   RESULTS  Summary of this visit's results, reviewed by myself:   EKG Interpretation  Date/Time:    Ventricular Rate:    PR Interval:    QRS Duration:   QT Interval:    QTC Calculation:   R Axis:     Text Interpretation:        Laboratory Studies: No results found for this or any previous visit (from the past 24 hour(s)). Imaging Studies: No results found.  ED COURSE  Nursing notes and initial vitals signs, including pulse oximetry, reviewed.  Vitals:   08/25/16 0533 08/25/16 0539  BP:  133/89  Pulse:  80  Resp:  17  Temp:  98.6 F (37 C)  TempSrc:  Oral  SpO2:  98%  Weight: 200 lb (90.7 kg)   Height: 5\' 6"  (1.676 m)    The patient declined referral to neurosurgery, chiropractic or "anybody in [place]". She states she prefers to "stay within [facility]" and will attempt to contact her pain management doctor later today. She declined prescriptions for additional narcotics, steroids or NSAIDs.   PROCEDURES    ED DIAGNOSES     ICD-9-CM ICD-10-CM   1. Cervical radiculopathy 723.4 M54.12        Paula Libra, MD 08/25/16 [phone]

## 2016-11-15 ENCOUNTER — Encounter (HOSPITAL_BASED_OUTPATIENT_CLINIC_OR_DEPARTMENT_OTHER): Payer: Self-pay | Admitting: Emergency Medicine

## 2016-11-15 ENCOUNTER — Emergency Department (HOSPITAL_BASED_OUTPATIENT_CLINIC_OR_DEPARTMENT_OTHER)
Admission: EM | Admit: 2016-11-15 | Discharge: 2016-11-15 | Disposition: A | Discharge status: Home / Self Care | Payer: Medicare Other | Attending: Emergency Medicine | Admitting: Emergency Medicine

## 2016-11-15 DIAGNOSIS — H60511 Acute actinic otitis externa, right ear: Secondary | ICD-10-CM | POA: Diagnosis not present

## 2016-11-15 DIAGNOSIS — M79601 Pain in right arm: Secondary | ICD-10-CM | POA: Diagnosis not present

## 2016-11-15 DIAGNOSIS — J029 Acute pharyngitis, unspecified: Secondary | ICD-10-CM | POA: Insufficient documentation

## 2016-11-15 DIAGNOSIS — H60501 Unspecified acute noninfective otitis externa, right ear: Secondary | ICD-10-CM

## 2016-11-15 DIAGNOSIS — R0981 Nasal congestion: Secondary | ICD-10-CM

## 2016-11-15 DIAGNOSIS — Z79899 Other long term (current) drug therapy: Secondary | ICD-10-CM | POA: Insufficient documentation

## 2016-11-15 DIAGNOSIS — H9203 Otalgia, bilateral: Secondary | ICD-10-CM | POA: Diagnosis present

## 2016-11-15 LAB — RAPID STREP SCREEN (MED CTR MEBANE ONLY): Streptococcus, Group A Screen (Direct): NEGATIVE

## 2016-11-15 MED ORDER — NEOMYCIN-POLYMYXIN-HC 3.5-10000-1 OT SUSP: 4.0000 [drp] | Freq: Three times a day (TID) | OTIC | 0 refills | Status: DC

## 2016-11-15 MED ORDER — AMOXICILLIN-POT CLAVULANATE 875-125 MG PO TABS: 1.0000 | ORAL_TABLET | Freq: Two times a day (BID) | ORAL | 0 refills | Status: AC

## 2016-11-15 MED ORDER — FLUTICASONE PROPIONATE 50 MCG/ACT NA SUSP: 2.0000 | Freq: Every day | NASAL | 0 refills | Status: DC

## 2016-11-15 NOTE — ED Notes (Signed)
ED Provider at bedside. 

## 2016-11-15 NOTE — ED Notes (Signed)
Patient's right ear is draining serous fluid it started as itching then pain.  This started last Thursday.  Both ears is hurting but right ear is worst.  She could hardly hear.

## 2016-11-15 NOTE — ED Triage Notes (Signed)
Pt presents with c/o ear pain sore throat and rt arm pain. Pt states ear and throat pain started Thursday and arm pain started a month ago.

## 2016-11-15 NOTE — Discharge Instructions (Signed)
Please take all of your antibiotics until finished!   You may develop abdominal discomfort or diarrhea from the antibiotic.  You may help offset this with probiotics which you can buy or get in yogurt. Do not eat  or take the probiotics until 2 hours after your antibiotic.   Use warm water salt gargles, warm teas, over-the-counter lozenges or sprays for your sore throat. Use Flonase for nasal congestion. Follow-up with your ENT for reevaluation of your urine infection.   Apply ice or heat to the extremity for comfort. Wear the shoulder sling for comfort. Do some gentle stretching during hot showers or baths to keep the arm from getting stiff. Follow-up with your neurologist for evaluation of your arm pain. He may also follow-up with Dr. Pearletha Forge the orthopedist for reevaluation. Return to the ED immediately if any concerning signs or symptoms develop.

## 2016-11-15 NOTE — ED Notes (Signed)
PMS intact before and after. Pt tolerated well. All questions answered. 

## 2016-11-15 NOTE — ED Provider Notes (Signed)
[facility] Provider Note   CSN: [id number] Arrival date & time: 11/15/16  4098   By signing my name below, I, Soijett Blue, attest that this documentation has been prepared under the direction and in the presence of Michela Pitcher, PA-C Electronically Signed: Soijett Blue, ED Scribe. 11/15/16. 1:06 AM.  History   Chief Complaint Chief Complaint  Patient presents with  . Otalgia  . Sore Throat  . Arm Pain    HPI [name] Steele is a 51 y.o. female who presents to the Emergency Department complaining of bilateral ear pain, right > left onset 2 days ago. Pt reports associated ear drainage on the right, sore throat, nasal congestion, nonproductive cough, chills, and mild frontal HA. Pt has tried OTC sinus medications with no relief of her symptoms. Pt notes that she has tympanostomy tubes in place. She denies CP and any other symptoms. Denies recent travel or surgeries, or hemoptysis.   Pt secondarily complains of intermittent right arm pain onset 2-3 months worsening last night. Pt has tried Rx hydrocodone, gabapentin, voltaren gel, and wearing a wrist brace for carpal tunnel with no relief of her symptoms. Currently pain is primarily in the upper arm. Pt right arm pain is worsened with laying down, upward movement of the arm and alleviated with holding her arm. She notes that she has been evaluated by a specialist who manages her neck and low back pain and informed that she had arthritis and carpal tunnel to her right arm. Pt reports that her follow up appointment is next month. Denies color change, wound, swelling, numbness, tingling, weakness, fever, recent injury, recent trauma, and any other symptoms  The history is provided by the patient. No language interpreter was used.    History reviewed. No pertinent past medical history.  There are no active problems to display for this patient.   Past Surgical History:  Procedure Laterality Date  . ABDOMINAL HYSTERECTOMY      . BREAST REDUCTION SURGERY    . FOOT SURGERY    . GASTRIC BYPASS    . TUBAL LIGATION      OB History    No data available       Home Medications    Prior to Admission medications   Medication Sig Start Date End Date Taking? Authorizing Provider  amoxicillin-clavulanate (AUGMENTIN) 875-125 MG tablet Take 1 tablet by mouth 2 (two) times daily. 11/15/16 11/22/16  Michela Pitcher A, PA-C  azithromycin (ZITHROMAX Z-PAK) 250 MG tablet 2 po day one, then 1 daily x 4 days 08/30/14   Hess, Nada Boozer, PA-C  benzonatate (TESSALON) 100 MG capsule Take 1 capsule (100 mg total) by mouth every 8 (eight) hours. 06/06/15   Rolland Porter, MD  diazepam (VALIUM) 5 MG tablet Take 1 tablet (5 mg total) by mouth every 12 (twelve) hours as needed for muscle spasms. 08/30/14   Hess, Nada Boozer, PA-C  diclofenac sodium (VOLTAREN) 1 % GEL Apply topically 4 (four) times daily.    [provider]  fluticasone (FLONASE) 50 MCG/ACT nasal spray Place 2 sprays into both nostrils daily. 11/15/16   Kenyada Dosch A, PA-C  HYDROCHLOROTHIAZIDE PO Take by mouth.    [provider]  HYDROcodone-acetaminophen (NORCO) 5-325 MG per tablet Take 1-2 tablets by mouth every 6 (six) hours as needed (for pain). 08/30/14   Molpus, John, MD  hydrocortisone cream 1 % Apply to affected area 2 times daily 11/03/13   Rancour, Jeannett Senior, MD  hydrOXYzine (ATARAX/VISTARIL) 25 MG tablet Take  1 tablet (25 mg total) by mouth every 6 (six) hours. 11/03/13   Rancour, Jeannett Senior, MD  levofloxacin (LEVAQUIN) 500 MG tablet Take 1 tablet (500 mg total) by mouth daily. 06/06/15   Rolland Porter, MD  neomycin-polymyxin-hydrocortisone (CORTISPORIN) 3.5-10000-1 OTIC suspension Place 4 drops into the right ear 3 (three) times daily. 11/15/16   Luevenia Maxin, Teliah Buffalo A, PA-C  ondansetron (ZOFRAN ODT) 4 MG disintegrating tablet Take 1 tablet (4 mg total) by mouth every 8 (eight) hours as needed for nausea. 06/06/15   Rolland Porter, MD  predniSONE (DELTASONE) 10 MG tablet Take 10 mg by mouth  daily with breakfast.    [provider]    Family History No family history on file.  Social History Social History  Substance Use Topics  . Smoking status: Never Smoker  . Smokeless tobacco: Never Used  . Alcohol use No     Allergies   Patient has no known allergies.   Review of Systems Review of Systems  Constitutional: Positive for chills. Negative for fever.  HENT: Positive for congestion, ear discharge, ear pain and sore throat.   Respiratory: Positive for cough.   Cardiovascular: Negative for chest pain.  Musculoskeletal: Positive for arthralgias (right arm). Negative for joint swelling.  Skin: Negative for color change and wound.  Neurological: Positive for headaches. Negative for weakness and numbness.       No tingling     Physical Exam Updated Vital Signs BP (!) 130/111 (BP Location: Left Arm) Comment: Taken twice. Triage RN aware  Pulse (!) 110   Temp 99.8 F (37.7 C) (Oral)   Resp 19   SpO2 100%   Physical Exam  Constitutional: She appears well-developed and well-nourished. No distress.  HENT:  Head: Normocephalic and atraumatic.  Right Ear: External ear normal.  Left Ear: External ear normal.  Nose: Mucosal edema present. Right sinus exhibits no maxillary sinus tenderness and no frontal sinus tenderness. Left sinus exhibits no maxillary sinus tenderness and no frontal sinus tenderness.  Mouth/Throat: Uvula is midline, oropharynx is clear and moist and mucous membranes are normal. No oropharyngeal exudate, posterior oropharyngeal edema or posterior oropharyngeal erythema.  No maxillary or frontal sinus TTP. Nasal septum is midline with pink mucosa and nasal congestion present. Posterior oropharynx mildly erythematous without tonsillar hypertrophy, exudates, or uvular deviation. Tonsils are surgically absent. No trismus or sublingual abnormalities  Left ear normal, TM with tympanostomy tube in place and serous fluid posteriorly. No pain on  outpatient or movement of the pinna of the right ear. TM with tympanostomy tube in place, no erythema or bulging. Serous fluid posteriorly noted. There are multiple blisters to the external ear canal. There is a yellow-white creamy drainage also noted in the ear canal. Serous fluid is draining from the ear. No tenderness palpation of the mastoids.  Eyes: Pupils are equal, round, and reactive to light. Conjunctivae and EOM are normal. Right eye exhibits no discharge. Left eye exhibits no discharge. No scleral icterus.  Neck: Normal range of motion. Neck supple. No JVD present. No tracheal deviation present. No thyromegaly present.  Cardiovascular: Normal rate, regular rhythm, normal heart sounds and intact distal pulses.  Exam reveals no gallop and no friction rub.   No murmur heard. Pulses:      Radial pulses are 2+ on the right side, and 2+ on the left side.  Pulmonary/Chest: Effort normal and breath sounds normal. No respiratory distress. She has no wheezes. She has no rales. She exhibits no tenderness.  Abdominal: She  exhibits no distension.  Musculoskeletal: Normal range of motion. She exhibits no edema, tenderness or deformity.  Pain with upward motion of the right upper extremity. Arc of pain is present. Positive supraspinatus empty can sign. Generalized tenderness to palpation of the right upper arm. 5/5 strength of the you eat major muscle groups, however with pain on movement of the right arm. The deformity or crepitus noted. No severe swelling or erythema noted.  Lymphadenopathy:    She has no cervical adenopathy.  Neurological: She is alert. No cranial nerve deficit or sensory deficit.  Fluent speech, no facial droop, sensation intact to soft touch of bilateral upper extremities  Skin: Skin is warm and dry. Capillary refill takes less than 2 seconds. No rash noted. She is not diaphoretic. No erythema. No pallor.  Psychiatric: She has a normal mood and affect. Her behavior is normal.    Nursing note and vitals reviewed.    ED Treatments / Results  DIAGNOSTIC STUDIES: Oxygen Saturation is 100% on RA, nl by my interpretation.    COORDINATION OF CARE: 9:17 PM Discussed treatment plan with pt at bedside which includes rapid strep screen and culture and pt agreed to plan.   Labs (all labs ordered are listed, but only abnormal results are displayed) Labs Reviewed  RAPID STREP SCREEN (NOT AT [facility])  CULTURE, GROUP A STREP [facility])    EKG  EKG Interpretation None       Radiology No results found.  Procedures Procedures (including critical care time)  Medications Ordered in ED Medications - No data to display   Initial Impression / Assessment and Plan / ED Course  I have reviewed the triage vital signs and the nursing notes.  Pertinent labs & imaging results that were available during my care of the patient were reviewed by me and considered in my medical decision making (see chart for details).     Patient presents with acute on chronic right arm pain as well as new onset ear pain, ear drainage, sore throat, and nasal congestion. Afebrile, initially tachycardic and hypertensive while in the ED with improvement prior to discharge. I suspect this is partially in response to pain. Examination of the right upper extremity consistent with musculoskeletal etiology, possible rotator cuff injury. Doubt fracture or dislocation in the absence of trauma. RICE therapy discussed and indicated. Will place in shoulder sling for comfort. She'll follow-up with orthopedics for reevaluation. Pt presenting with otitis externa. Pt afebrile in NAD. Exam not concerning for mastoiditis, Ramsay Hunt syndrome, cellulitis or malignant OE. Discharge with Augmentin and otic drops as well as Flonase for nasal congestion.  Advised follow up with  primary care in 2-3 days if no improvement.  Return precautions discussed. Pt verbalized understanding of and agreement with plan and is safe for  discharge home at this time.   Final Clinical Impressions(s) / ED Diagnoses   Final diagnoses:  Acute otitis externa of right ear, unspecified type  Nasal congestion  Sore throat  Right arm pain    New Prescriptions Discharge Medication List as of 11/15/2016 10:07 PM    START taking these medications   Details  amoxicillin-clavulanate (AUGMENTIN) 875-125 MG tablet Take 1 tablet by mouth 2 (two) times daily., Starting Sat 11/15/2016, Until Sat 11/22/2016, Print    fluticasone (FLONASE) 50 MCG/ACT nasal spray Place 2 sprays into both nostrils daily., Starting Sat 11/15/2016, Print    neomycin-polymyxin-hydrocortisone (CORTISPORIN) 3.5-10000-1 OTIC suspension Place 4 drops into the right ear 3 (three) times daily., Starting Sat  11/15/2016, Print      I personally performed the services described in this documentation, which was scribed in my presence. The recorded information has been reviewed and is accurate.     Jeanie Sewer, PA-C 11/16/16 [id number]    Geoffery Lyons, MD 11/16/16 [phone]

## 2016-11-18 LAB — CULTURE, GROUP A STREP (THRC)

## 2016-11-25 ENCOUNTER — Ambulatory Visit (INDEPENDENT_AMBULATORY_CARE_PROVIDER_SITE_OTHER): Payer: Medicare Other | Admitting: Family Medicine

## 2016-11-25 ENCOUNTER — Encounter: Payer: Self-pay | Admitting: Family Medicine

## 2016-11-25 DIAGNOSIS — M25511 Pain in right shoulder: Secondary | ICD-10-CM | POA: Diagnosis not present

## 2016-11-25 NOTE — Patient Instructions (Signed)
You have rotator cuff impingement and subacromial bursitis. Try to avoid painful activities (overhead activities, lifting with extended arm) as much as possible. Voltaren gel up to 4 times a day. Can take tylenol in addition to this. You may want to check with the doctors who did your bypass - usually aleve, ibuprofen products are the ones they don't want you to take but they're ok with tylenol. Subacromial injection may be beneficial to help with pain and to decrease inflammation - we did this today. Consider physical therapy with transition to home exercise program. Do home exercise program with theraband and scapular stabilization exercises daily - these are very important for long term relief even if an injection was given.  Work your way up to doing 3 sets of 10 once a day. If not improving at follow-up we will consider further imaging, physical therapy, and/or nitro patches. Follow up with me in 1 month.

## 2016-11-27 DIAGNOSIS — K289 Gastrojejunal ulcer, unspecified as acute or chronic, without hemorrhage or perforation: Secondary | ICD-10-CM | POA: Insufficient documentation

## 2016-11-27 DIAGNOSIS — M25511 Pain in right shoulder: Secondary | ICD-10-CM | POA: Insufficient documentation

## 2016-11-27 HISTORY — DX: Gastrojejunal ulcer, unspecified as acute or chronic, without hemorrhage or perforation: K28.9

## 2016-11-27 NOTE — Progress Notes (Signed)
PCP: Arliss Journey, PA-C  Subjective:   HPI: Patient is a 51 y.o. female here for right shoulder pain.  Patient reports she's had about 2 months of lateral right shoulder pain. Worse at nighttime. No acute injury or trauma. Pain level up to 7/10 and sharp. Has chronic neck, back problems. History of carpal tunnel. Right handed. Has tried gabapentin, takes norco for back. Not done physical therapy for shoulder. No skin changes, no fever.  No past medical history on file.  Current Outpatient Prescriptions on File Prior to Visit  Medication Sig Dispense Refill  . diclofenac sodium (VOLTAREN) 1 % GEL Apply topically 4 (four) times daily.    . fluticasone (FLONASE) 50 MCG/ACT nasal spray Place 2 sprays into both nostrils daily. 16 g 0   No current facility-administered medications on file prior to visit.     Past Surgical History:  Procedure Laterality Date  . ABDOMINAL HYSTERECTOMY    . BREAST REDUCTION SURGERY    . FOOT SURGERY    . GASTRIC BYPASS    . TUBAL LIGATION      No Known Allergies  Social History   Social History  . Marital status: Single    Spouse name: N/A  . Number of children: N/A  . Years of education: N/A   Occupational History  . Not on file.   Social History Main Topics  . Smoking status: Never Smoker  . Smokeless tobacco: Never Used  . Alcohol use No  . Drug use: No  . Sexual activity: Yes    Birth control/ protection: Surgical   Other Topics Concern  . Not on file   Social History Narrative  . No narrative on file    No family history on file.  BP 107/76   Ht 5\' 6"  (1.676 m)   Wt 200 lb (90.7 kg)   BMI 32.28 kg/m   Review of Systems: See HPI above.     Objective:  Physical Exam:  Gen: NAD, comfortable in exam room  Right shoulder: No swelling, ecchymoses.  No gross deformity. No TTP. FROM with painful arc. Positive Hawkins, Neers. Negative Yergasons. Strength 5/5 with empty can and resisted internal/external  rotation.  Pain empty can. Pain but no instability with apprehension. NV intact distally.  Left shoulder: FROM without pain.  MSK u/s right shoulder:  Biceps tendon thickened but no tears, target sign.  No subluxation of biceps tendon.  Subscapularis, infraspinatus, supraspinatus muscles intact without abnormalities.  Subacromial bursitis noted.  AC joint appears normal.   Assessment & Plan:  1. Right shoulder pain - 2/2 impingement and subacromial bursitis.  Voltaren gel.  Shown home exercises to do daily.  Subacromial injection given today.  Consider physical therapy, MRI, nitro patches if not improving.  After informed written consent, patient was seated on exam table. Right shoulder was prepped with alcohol swab and utilizing posterior approach, patient's right subacromial space was injected with 3:1 bupivicaine: depomedrol. Patient tolerated the procedure well without immediate complications.

## 2016-11-27 NOTE — Assessment & Plan Note (Signed)
2/2 impingement and subacromial bursitis.  Voltaren gel.  Shown home exercises to do daily.  Subacromial injection given today.  Consider physical therapy, MRI, nitro patches if not improving.  After informed written consent, patient was seated on exam table. Right shoulder was prepped with alcohol swab and utilizing posterior approach, patient's right subacromial space was injected with 3:1 bupivicaine: depomedrol. Patient tolerated the procedure well without immediate complications.

## 2016-12-25 ENCOUNTER — Encounter: Payer: Self-pay | Admitting: Family Medicine

## 2016-12-25 ENCOUNTER — Ambulatory Visit (INDEPENDENT_AMBULATORY_CARE_PROVIDER_SITE_OTHER): Payer: Medicare Other | Admitting: Family Medicine

## 2016-12-25 DIAGNOSIS — K222 Esophageal obstruction: Secondary | ICD-10-CM | POA: Insufficient documentation

## 2016-12-25 DIAGNOSIS — M25511 Pain in right shoulder: Secondary | ICD-10-CM

## 2016-12-25 NOTE — Patient Instructions (Signed)
Do the home exercises 2-3 times a week for 4-6 more weeks then can stop. Call me if you have any problems otherwise follow up as needed.

## 2016-12-25 NOTE — Progress Notes (Signed)
PCP: Arliss Journey, PA-C  Subjective:   HPI: Patient is a 51 y.o. female here for right shoulder pain.  7/24: Patient reports she's had about 2 months of lateral right shoulder pain. Worse at nighttime. No acute injury or trauma. Pain level up to 7/10 and sharp. Has chronic neck, back problems. History of carpal tunnel. Right handed. Has tried gabapentin, takes norco for back. Not done physical therapy for shoulder. No skin changes, no fever.  8/23: Patient reports she's doing very well. No pain currently. Injection helped a lot. Did home exercises also. Able to sleep without waking up. No skin changes, numbness.  No past medical history on file.  Current Outpatient Prescriptions on File Prior to Visit  Medication Sig Dispense Refill  . cetirizine (ZYRTEC) 10 MG tablet TK 1 T PO QD  0  . diclofenac sodium (VOLTAREN) 1 % GEL Apply topically 4 (four) times daily.    Marland Kitchen esomeprazole (NEXIUM) 40 MG capsule TK 1 C PO EVERY EVE  0  . fluticasone (FLONASE) 50 MCG/ACT nasal spray Place 2 sprays into both nostrils daily. 16 g 0  . hydrochlorothiazide (HYDRODIURIL) 25 MG tablet TK 1 T PO ONCE D  0  . rOPINIRole (REQUIP) 0.25 MG tablet      No current facility-administered medications on file prior to visit.     Past Surgical History:  Procedure Laterality Date  . ABDOMINAL HYSTERECTOMY    . BREAST REDUCTION SURGERY    . FOOT SURGERY    . GASTRIC BYPASS    . TUBAL LIGATION      No Known Allergies  Social History   Social History  . Marital status: Single    Spouse name: N/A  . Number of children: N/A  . Years of education: N/A   Occupational History  . Not on file.   Social History Main Topics  . Smoking status: Never Smoker  . Smokeless tobacco: Never Used  . Alcohol use No  . Drug use: No  . Sexual activity: Yes    Birth control/ protection: Surgical   Other Topics Concern  . Not on file   Social History Narrative  . No narrative on file    No  family history on file.  BP 137/84   Pulse (!) 101   Ht 5\' 6"  (1.676 m)   Wt 200 lb (90.7 kg)   BMI 32.28 kg/m   Review of Systems: See HPI above.     Objective:  Physical Exam:  Gen: NAD, comfortable in exam room  Right shoulder: No swelling, ecchymoses.  No gross deformity. No TTP. FROM. Negative Hawkins, Neers. Negative Yergasons. Strength 5/5 with empty can and resisted internal/external rotation. Negative apprehension. NV intact distally.  Left shoulder: FROM without pain.    Assessment & Plan:  1. Right shoulder pain - 2/2 impingement and subacromial bursitis.  Resolved with home exercises, subacromial injection.  Encouraged home exercises for 4-6 more weeks.  Tylenol, voltaren gel only if needed.  F/u prn.

## 2016-12-25 NOTE — Assessment & Plan Note (Signed)
2/2 impingement and subacromial bursitis.  Resolved with home exercises, subacromial injection.  Encouraged home exercises for 4-6 more weeks.  Tylenol, voltaren gel only if needed.  F/u prn.

## 2017-01-27 ENCOUNTER — Encounter: Payer: Self-pay | Admitting: Family Medicine

## 2017-01-27 ENCOUNTER — Ambulatory Visit (INDEPENDENT_AMBULATORY_CARE_PROVIDER_SITE_OTHER): Payer: Medicare Other | Admitting: Family Medicine

## 2017-01-27 DIAGNOSIS — M25511 Pain in right shoulder: Secondary | ICD-10-CM

## 2017-01-27 MED ORDER — PREDNISONE 10 MG PO TABS: ORAL_TABLET | ORAL | 0 refills | Status: DC

## 2017-01-27 NOTE — Patient Instructions (Addendum)
You have mild impingement on exam. Try prednisone dose pack for 6 days. Ok to take flexeril, hydrocodone with this if needed. Try to avoid painful activities (overhead activities, lifting with extended arm) as much as possible. If you do the home rotator cuff exercises do them with the yellow theraband. Let me know how you're doing over the next 1-2 weeks. Consider injection if you're not improving as expected.

## 2017-01-29 NOTE — Assessment & Plan Note (Signed)
2/2 impingement and subacromial bursitis.  Recent flare up over the weekend with mild painful arc now and some local nerve irritation.  Start prednisone dose pack.  Ok to take her flexeril and hydrocodone with this.  Avoid overhead motions, reaching.  Strengthening with light yellow theraband.  let us know how she's doing over 1-2 weeks.  consider subacromial injection if not improving.

## 2017-01-29 NOTE — Progress Notes (Signed)
PCP: Arliss Journey, PA-C  Subjective:   HPI: Patient is a 51 y.o. female here for right shoulder pain.  7/24: Patient reports she's had about 2 months of lateral right shoulder pain. Worse at nighttime. No acute injury or trauma. Pain level up to 7/10 and sharp. Has chronic neck, back problems. History of carpal tunnel. Right handed. Has tried gabapentin, takes norco for back. Not done physical therapy for shoulder. No skin changes, no fever.  8/23: Patient reports she's doing very well. No pain currently. Injection helped a lot. Did home exercises also. Able to sleep without waking up. No skin changes, numbness.  9/25: Patient reports she's getting better though worsened over this weekend. Using voltaren gel and biofreeze. Pain level 6/10. Pain right shoulder into upper arm. Some numbness locally. Motion is ok. Taking flexeril and hydrocodone. No radiation of pain. No skin changes.  No past medical history on file.  Current Outpatient Prescriptions on File Prior to Visit  Medication Sig Dispense Refill  . cetirizine (ZYRTEC) 10 MG tablet TK 1 T PO QD  0  . diclofenac sodium (VOLTAREN) 1 % GEL Apply topically 4 (four) times daily.    Marland Kitchen esomeprazole (NEXIUM) 40 MG capsule TK 1 C PO EVERY EVE  0  . fluticasone (FLONASE) 50 MCG/ACT nasal spray Place 2 sprays into both nostrils daily. 16 g 0  . gabapentin (NEURONTIN) 300 MG capsule     . hydrochlorothiazide (HYDRODIURIL) 25 MG tablet TK 1 T PO ONCE D  0  . rOPINIRole (REQUIP) 0.25 MG tablet     . TRANSDERM-SCOP, 1.5 MG, 1 MG/3DAYS UNW AND APP 1 PA TO SKIN Q 3 DAYS.  1   No current facility-administered medications on file prior to visit.     Past Surgical History:  Procedure Laterality Date  . ABDOMINAL HYSTERECTOMY    . BREAST REDUCTION SURGERY    . FOOT SURGERY    . GASTRIC BYPASS    . TUBAL LIGATION      No Known Allergies  Social History   Social History  . Marital status: Single    Spouse name:  N/A  . Number of children: N/A  . Years of education: N/A   Occupational History  . Not on file.   Social History Main Topics  . Smoking status: Never Smoker  . Smokeless tobacco: Never Used  . Alcohol use No  . Drug use: No  . Sexual activity: Yes    Birth control/ protection: Surgical   Other Topics Concern  . Not on file   Social History Narrative  . No narrative on file    No family history on file.  BP 130/88   Pulse 91   Ht 5\' 6"  (1.676 m)   Wt 200 lb (90.7 kg)   BMI 32.28 kg/m   Review of Systems: See HPI above.     Objective:  Physical Exam:  Gen: NAD, comfortable in exam room  Right shoulder: No swelling, ecchymoses.  No gross deformity. No TTP. FROM with mild painful arc. Negative Hawkins, Neers. Negative Yergasons. Strength 5/5 with empty can and resisted internal/external rotation. Negative apprehension. NV intact distally.  Left shoulder: No swelling, ecchymoses.  No gross deformity. No TTP. FROM. Strength 5/5 with empty can and resisted internal/external rotation. NV intact distally.   Assessment & Plan:  1. Right shoulder pain - 2/2 impingement and subacromial bursitis.  Recent flare up over the weekend with mild painful arc now and some local nerve irritation.  Start prednisone dose pack.  Ok to take her flexeril and hydrocodone with this.  Avoid overhead motions, reaching.  Strengthening with light yellow theraband.  let us know how she's doing over 1-2 weeks.  consider subacromial injection if not improving.

## 2017-03-20 ENCOUNTER — Other Ambulatory Visit: Payer: Self-pay | Admitting: Family Medicine

## 2017-03-20 ENCOUNTER — Encounter: Payer: Self-pay | Admitting: Family Medicine

## 2017-03-20 ENCOUNTER — Ambulatory Visit (INDEPENDENT_AMBULATORY_CARE_PROVIDER_SITE_OTHER): Payer: Medicare Other | Admitting: Family Medicine

## 2017-03-20 DIAGNOSIS — M25511 Pain in right shoulder: Secondary | ICD-10-CM | POA: Diagnosis not present

## 2017-03-20 DIAGNOSIS — M7541 Impingement syndrome of right shoulder: Secondary | ICD-10-CM | POA: Diagnosis not present

## 2017-03-20 MED ORDER — NITROGLYCERIN 0.2 MG/HR TD PT24: MEDICATED_PATCH | TRANSDERMAL | 1 refills | Status: DC

## 2017-03-20 MED ORDER — DICLOFENAC SODIUM 75 MG PO TBEC: 75.0000 mg | DELAYED_RELEASE_TABLET | Freq: Two times a day (BID) | ORAL | 1 refills | Status: DC

## 2017-03-20 NOTE — Patient Instructions (Signed)
You have rotator cuff impingement Try to avoid painful activities (overhead activities, lifting with extended arm) as much as possible. Voltaren 75mg  twice a day with food for pain and inflammation. Nitro patches 1/4th patch to affected shoulder, change daily. Can take tylenol in addition to this. Subacromial injection may be beneficial to help with pain and to decrease inflammation. Consider physical therapy with transition to home exercise program. Do home exercise program with theraband and scapular stabilization exercises daily 3 sets of 10 once a day. If not improving at follow-up we will consider further imaging, injection, physical therapy. Follow up with me in 6 weeks but call me sooner if you're not improving and want to do the shot.

## 2017-03-25 ENCOUNTER — Encounter: Payer: Self-pay | Admitting: Family Medicine

## 2017-03-25 NOTE — Progress Notes (Signed)
PCP: Arliss JourneyWorley, Michelle, PA-C  Subjective:   HPI: Patient is a 51 y.o. female here for right shoulder pain.  7/24: Patient reports she's had about 2 months of lateral right shoulder pain. Worse at nighttime. No acute injury or trauma. Pain level up to 7/10 and sharp. Has chronic neck, back problems. History of carpal tunnel. Right handed. Has tried gabapentin, takes norco for back. Not done physical therapy for shoulder. No skin changes, no fever.  8/23: Patient reports she's doing very well. No pain currently. Injection helped a lot. Did home exercises also. Able to sleep without waking up. No skin changes, numbness.  9/25: Patient reports she's getting better though worsened over this weekend. Using voltaren gel and biofreeze. Pain level 6/10. Pain right shoulder into upper arm. Some numbness locally. Motion is ok. Taking flexeril and hydrocodone. No radiation of pain. No skin changes.  11/16: Patient reports she was doing better then last week the pain came back. Couldn't move without pain last night. Pain level 5/10, sharp. Prednisone did help her. Pain worse with picking up items. Pain goes under her right shoulder into armpit. Worse at nighttime.  History reviewed. No pertinent past medical history.  Current Outpatient Medications on File Prior to Visit  Medication Sig Dispense Refill  . cyanocobalamin (,VITAMIN B-12,) 1000 MCG/ML injection Inject into the muscle.    Marland Kitchen. LORazepam (ATIVAN) 0.5 MG tablet Take 0.5 mg by mouth.    . nystatin cream (MYCOSTATIN) Apply topically.    . cetirizine (ZYRTEC) 10 MG tablet TK 1 T PO QD  0  . Cholecalciferol 50000 units TABS Take by mouth.    . cyclobenzaprine (FLEXERIL) 10 MG tablet TK 1 T PO Q 8 H PRN  2  . diclofenac sodium (VOLTAREN) 1 % GEL Apply topically 4 (four) times daily.    Marland Kitchen. esomeprazole (NEXIUM) 40 MG capsule TK 1 C PO EVERY EVE  0  . fluticasone (FLONASE) 50 MCG/ACT nasal spray Place 2 sprays into both  nostrils daily. 16 g 0  . gabapentin (NEURONTIN) 300 MG capsule     . hydrochlorothiazide (HYDRODIURIL) 25 MG tablet TK 1 T PO ONCE D  0  . HYDROcodone-acetaminophen (NORCO) 10-325 MG tablet TK 1 T PO Q 8 H PRN  0  . predniSONE (DELTASONE) 10 MG tablet 6 tabs po day 1, 5 tabs po day 2, 4 tabs po day 3, 3 tabs po day 4, 2 tabs po day 5, 1 tab po day 6 21 tablet 0  . rOPINIRole (REQUIP) 0.25 MG tablet     . TRANSDERM-SCOP, 1.5 MG, 1 MG/3DAYS UNW AND APP 1 PA TO SKIN Q 3 DAYS.  1   No current facility-administered medications on file prior to visit.     Past Surgical History:  Procedure Laterality Date  . ABDOMINAL HYSTERECTOMY    . BREAST REDUCTION SURGERY    . FOOT SURGERY    . GASTRIC BYPASS    . TUBAL LIGATION      No Known Allergies  Social History   Socioeconomic History  . Marital status: Single    Spouse name: Not on file  . Number of children: Not on file  . Years of education: Not on file  . Highest education level: Not on file  Social Needs  . Financial resource strain: Not on file  . Food insecurity - worry: Not on file  . Food insecurity - inability: Not on file  . Transportation needs - medical: Not on file  .  Transportation needs - non-medical: Not on file  Occupational History  . Not on file  Tobacco Use  . Smoking status: Never Smoker  . Smokeless tobacco: Never Used  Substance and Sexual Activity  . Alcohol use: No  . Drug use: No  . Sexual activity: Yes    Birth control/protection: Surgical  Other Topics Concern  . Not on file  Social History Narrative  . Not on file    History reviewed. No pertinent family history.  BP (!) 137/95   Pulse 90   Ht 5\' 6"  (1.676 m)   Wt 200 lb (90.7 kg)   BMI 32.28 kg/m   Review of Systems: See HPI above.     Objective:  Physical Exam:  Gen: NAD, comfortable in exam room.  Right shoulder: No swelling, ecchymoses.  No gross deformity. No TTP. FROM with painful arc. Positive Hawkins, Neers. Negative  Yergasons. Strength 5/5 with empty can and resisted internal/external rotation. Negative apprehension. NV intact distally.  Left shoulder: No swelling, ecchymoses.  No gross deformity. No TTP. FROM. Strength 5/5 with empty can and resisted internal/external rotation. NV intact distally.   Assessment & Plan:  1. Right shoulder pain - 2/2 rotator cuff impingement.  Reviewed home exercises again.  Voltaren twice a day with nitro patches also (discussed risks of skin changes, headache).  Consider injection, imaging, physical therapy if not improving.  F/u in 6 weeks.

## 2017-03-25 NOTE — Assessment & Plan Note (Signed)
2/2 rotator cuff impingement.  Reviewed home exercises again.  Voltaren twice a day with nitro patches also (discussed risks of skin changes, headache).  Consider injection, imaging, physical therapy if not improving.  F/u in 6 weeks.

## 2017-04-09 ENCOUNTER — Telehealth: Payer: Self-pay | Admitting: Family Medicine

## 2017-04-09 NOTE — Telephone Encounter (Signed)
Patient came in the office with questions regarding Nitroglycerin patch. She is currently using the patch on the right arm and wants to know if she can start using the patch on the left arm as well. She is starting to experience some pain in the left arm

## 2017-04-09 NOTE — Telephone Encounter (Signed)
This is ok to use - 1/4th patch to each shoulder.  Thanks!

## 2017-04-09 NOTE — Telephone Encounter (Signed)
Patient was informed.

## 2017-04-26 ENCOUNTER — Emergency Department (HOSPITAL_BASED_OUTPATIENT_CLINIC_OR_DEPARTMENT_OTHER)
Admission: EM | Admit: 2017-04-26 | Discharge: 2017-04-26 | Disposition: A | Discharge status: Home / Self Care | Payer: Medicare Other | Attending: Emergency Medicine | Admitting: Emergency Medicine

## 2017-04-26 ENCOUNTER — Other Ambulatory Visit: Payer: Self-pay

## 2017-04-26 ENCOUNTER — Emergency Department (HOSPITAL_BASED_OUTPATIENT_CLINIC_OR_DEPARTMENT_OTHER): Payer: Medicare Other

## 2017-04-26 ENCOUNTER — Encounter (HOSPITAL_BASED_OUTPATIENT_CLINIC_OR_DEPARTMENT_OTHER): Payer: Self-pay | Admitting: Emergency Medicine

## 2017-04-26 DIAGNOSIS — R609 Edema, unspecified: Secondary | ICD-10-CM

## 2017-04-26 DIAGNOSIS — R52 Pain, unspecified: Secondary | ICD-10-CM

## 2017-04-26 DIAGNOSIS — M791 Myalgia, unspecified site: Secondary | ICD-10-CM | POA: Insufficient documentation

## 2017-04-26 LAB — URINALYSIS, ROUTINE W REFLEX MICROSCOPIC
BILIRUBIN URINE: NEGATIVE
Glucose, UA: NEGATIVE mg/dL
Hgb urine dipstick: NEGATIVE
KETONES UR: NEGATIVE mg/dL
Leukocytes, UA: NEGATIVE
NITRITE: NEGATIVE
Protein, ur: NEGATIVE mg/dL
Specific Gravity, Urine: 1.02 (ref 1.005–1.030)
pH: 7 (ref 5.0–8.0)

## 2017-04-26 LAB — CBC WITH DIFFERENTIAL/PLATELET
BASOS PCT: 1 %
Basophils Absolute: 0.1 10*3/uL (ref 0.0–0.1)
EOS ABS: 0.2 10*3/uL (ref 0.0–0.7)
Eosinophils Relative: 3 %
HCT: 38.4 % (ref 36.0–46.0)
Hemoglobin: 12.6 g/dL (ref 12.0–15.0)
Lymphocytes Relative: 39 %
Lymphs Abs: 2.5 10*3/uL (ref 0.7–4.0)
MCH: 29.2 pg (ref 26.0–34.0)
MCHC: 32.8 g/dL (ref 30.0–36.0)
MCV: 89.1 fL (ref 78.0–100.0)
MONO ABS: 0.5 10*3/uL (ref 0.1–1.0)
MONOS PCT: 8 %
Neutro Abs: 3.3 10*3/uL (ref 1.7–7.7)
Neutrophils Relative %: 49 %
Platelets: 337 10*3/uL (ref 150–400)
RBC: 4.31 MIL/uL (ref 3.87–5.11)
RDW: 12.6 % (ref 11.5–15.5)
WBC: 6.5 10*3/uL (ref 4.0–10.5)

## 2017-04-26 LAB — BASIC METABOLIC PANEL
Anion gap: 9 (ref 5–15)
BUN: 14 mg/dL (ref 6–20)
CALCIUM: 9 mg/dL (ref 8.9–10.3)
CO2: 24 mmol/L (ref 22–32)
CREATININE: 0.81 mg/dL (ref 0.44–1.00)
Chloride: 106 mmol/L (ref 101–111)
GFR calc non Af Amer: >60 mL/min (ref >60)
GLUCOSE: 78 mg/dL (ref 65–99)
Potassium: 3.7 mmol/L (ref 3.5–5.1)
Sodium: 139 mmol/L (ref 135–145)

## 2017-04-26 LAB — BRAIN NATRIURETIC PEPTIDE: B NATRIURETIC PEPTIDE 5: 21.6 pg/mL (ref 0.0–100.0)

## 2017-04-26 NOTE — Discharge Instructions (Signed)
You were seen in the ED today with some muscle tightness and swelling in the hands and feet. Your labs are normal. Call your PCP to schedule the next available follow up appointment. Limit your sodium intake to 3,000 mg daily and keep the legs elevated when laying down.   Return to the ED with any chest pain or difficulty breathing.

## 2017-04-26 NOTE — ED Provider Notes (Signed)
Emergency Department Provider Note   I have reviewed the triage vital signs and the nursing notes.   HISTORY  Chief Complaint Generalized Body Aches   HPI [name] Steele is a 51 y.o. female presents to the emergency department for evaluation of aching pain in her shoulders and arms with associated heavy/swollen feeling.  She has been compliant with her HCTZ.  Feels that her hands are tight.  No chest pain or dyspnea.  No exertional dyspnea.  No positional changes.  No radiation of symptoms.  No modifying factors.  Patient been taking her pain medication and using the topical gel for her aching pain with no significant relief.    History reviewed. No pertinent past medical history.  Patient Active Problem List   Diagnosis Date Noted  . Benign esophageal stricture 12/25/2016  . 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 Morbid obesity (HCC) 11/27/2016  . Right shoulder pain 11/27/2016  . Diverticulosis 06/13/2011  . OSA on CPAP 05/20/2011  . Functional constipation 03/31/2011  . Other B-complex deficiencies 01/20/2011  . Other and unspecified hyperlipidemia 12/02/2010  . Vitamin D deficiency 12/02/2010    Past Surgical History:  Procedure Laterality Date  . ABDOMINAL HYSTERECTOMY    . BREAST REDUCTION SURGERY    . FOOT SURGERY    . GASTRIC BYPASS    . TUBAL LIGATION      Current Outpatient Rx  . Order #: [id number] Class: Historical Med  . Order #: [id number] Class: Historical Med  . Order #: [id number] Class: Historical Med  . Order #: [id number] Class: Historical Med  . Order #: [id number] Class: Normal  . Order #: [id number] Class: Historical Med  . Order #: [id number] Class: Historical Med  . Order #: [id number] Class: Print  . Order #: [id number] Class: Historical Med  . Order #: [id number] Class: Historical Med  . Order #: [id number] Class: Historical Med  . Order #: [id number] Class: Historical Med  . Order #: [id number] Class: Normal  . Order #: [id number] Class: Historical Med  .  Order #: [id number] Class: Normal  . Order #: [id number] Class: Historical Med  . Order #: [id number] Class: Historical Med    Allergies Patient has no known allergies.  History reviewed. No pertinent family history.  Social History Social History   Tobacco Use  . Smoking status: Never Smoker  . Smokeless tobacco: Never Used  Substance Use Topics  . Alcohol use: No  . Drug use: No    Review of Systems  Constitutional: No fever/chills Eyes: No visual changes. ENT: No sore throat. Cardiovascular: Denies chest pain. Respiratory: Denies shortness of breath. Gastrointestinal: No abdominal pain.  No nausea, no vomiting.  No diarrhea.  No constipation. Genitourinary: Negative for dysuria. Musculoskeletal: Negative for back pain. Cramping pain in the shoulders. Tightness and swelling in the hands and feet.  Skin: Negative for rash.  Neurological: Negative for headaches, focal weakness or numbness.  10-point ROS otherwise negative.  ____________________________________________   PHYSICAL EXAM:  VITAL SIGNS: ED Triage Vitals  Enc Vitals Group     BP 04/26/17 1819 130/87     Pulse Rate 04/26/17 1819 96     Resp 04/26/17 1819 20     Temp 04/26/17 1819 98.3 F (36.8 C)     Temp Source 04/26/17 1819 Oral     SpO2 04/26/17 1819 100 %     Weight 04/26/17 1817 200 lb (90.7 kg)     Height 04/26/17 1817 5\' 5"  (1.651 m)     Pain Score 04/26/17 1817 7   Constitutional: Alert and oriented. Well appearing and in  no acute distress. Eyes: Conjunctivae are normal.  Head: Atraumatic. Nose: No congestion/rhinnorhea. Mouth/Throat: Mucous membranes are moist.   Neck: No stridor.  Cardiovascular: Normal rate, regular rhythm. Good peripheral circulation. Grossly normal heart sounds.   Respiratory: Normal respiratory effort.  No retractions. Lungs CTAB. Gastrointestinal: Soft and nontender. No distention.  Musculoskeletal: No lower extremity tenderness with trace pitting edema. No gross  deformities of extremities. Neurologic:  Normal speech and language. No gross focal neurologic deficits are appreciated.  Skin:  Skin is warm, dry and intact. No rash noted.  ____________________________________________   LABS (all labs ordered are listed, but only abnormal results are displayed)  Labs Reviewed  BASIC METABOLIC PANEL  CBC WITH DIFFERENTIAL/PLATELET  URINALYSIS, ROUTINE W REFLEX MICROSCOPIC  BRAIN NATRIURETIC PEPTIDE   ____________________________________________  RADIOLOGY  Dg Chest 2 View  Result Date: 04/26/2017 CLINICAL DATA:  Entire body feels heavy and swelling. EXAM: CHEST  2 VIEW COMPARISON:  05/19/2016 and 06/06/2015 FINDINGS: Stable linear density at the left lung base is suggestive for scarring. No new airspace disease or pulmonary edema. Heart and mediastinum are within normal limits. Trachea is midline. No acute bone abnormality. Degenerative changes in the lower thoracic spine. IMPRESSION: No active cardiopulmonary disease. Electronically Signed   By: Richarda OverlieAdam  Henn M.D.   On: 04/26/2017 19:20    ____________________________________________   PROCEDURES  Procedure(s) performed:   Procedures  None ____________________________________________   INITIAL IMPRESSION / ASSESSMENT AND PLAN / ED COURSE  Pertinent labs & imaging results that were available during my care of the patient were reviewed by me and considered in my medical decision making (see chart for details).  Patient presents emergency department for evaluation of generalized body aches to the arms sensation of swelling and heaviness.  She has no focal neurological deficits.  She does have some trace pitting edema.  Plan for evaluation of kidney function with labs, UA, rule out fluid on chest x-ray.   08:35 PM Labs are unremarkable.  Chest x-ray negative.  Discussed limiting salt intake to 3000 mg daily and calling the primary care physician on Wednesday to discuss follow-up treatment  plan and any medication changes that might be necessary.  Patient does not appear acutely volume overloaded or in respiratory distress on my exam.  Bleed she is safe for discharge.  Discussed return precautions in detail.  At this time, I do not feel there is any life-threatening condition present. I have reviewed and discussed all results (EKG, imaging, lab, urine as appropriate), exam findings with patient. I have reviewed nursing notes and appropriate previous records.  I feel the patient is safe to be discharged home without further emergent workup. Discussed usual and customary return precautions. Patient and family (if present) verbalize understanding and are comfortable with this plan.  Patient will follow-up with their primary care provider. If they do not have a primary care provider, information for follow-up has been provided to them. All questions have been answered.  ____________________________________________  FINAL CLINICAL IMPRESSION(S) / ED DIAGNOSES  Final diagnoses:  Body aches  Edema, peripheral    Note:  This document was prepared using Dragon voice recognition software and may include unintentional dictation errors.  Alona BeneJoshua Delane Wessinger, MD Emergency Medicine    Satina Jerrell, Arlyss RepressJoshua G, MD 04/26/17 2037

## 2017-04-26 NOTE — ED Triage Notes (Signed)
Patient states that she is here because she is having generalized aches to her arms and she feels like everything is heavy. The patient states that she also feels swollen all over

## 2017-04-26 NOTE — ED Notes (Signed)
Patient transported to X-ray 

## 2017-05-06 ENCOUNTER — Ambulatory Visit: Payer: Medicare Other | Admitting: Family Medicine

## 2017-06-07 ENCOUNTER — Other Ambulatory Visit: Payer: Self-pay | Admitting: Family Medicine

## 2017-09-03 ENCOUNTER — Other Ambulatory Visit: Payer: Self-pay | Admitting: Family Medicine

## 2018-06-25 ENCOUNTER — Ambulatory Visit (HOSPITAL_BASED_OUTPATIENT_CLINIC_OR_DEPARTMENT_OTHER)
Admission: RE | Admit: 2018-06-25 | Discharge: 2018-06-25 | Disposition: A | Discharge status: Home / Self Care | Payer: Medicare Other | Source: Ambulatory Visit | Attending: Family Medicine | Admitting: Family Medicine

## 2018-06-25 ENCOUNTER — Ambulatory Visit (INDEPENDENT_AMBULATORY_CARE_PROVIDER_SITE_OTHER): Payer: Medicare Other | Admitting: Family Medicine

## 2018-06-25 ENCOUNTER — Encounter: Payer: Self-pay | Admitting: Family Medicine

## 2018-06-25 VITALS — BP 110/87 | HR 96 | Ht 66.0 in | Wt 210.0 lb

## 2018-06-25 DIAGNOSIS — M542 Cervicalgia: Secondary | ICD-10-CM | POA: Diagnosis present

## 2018-06-25 MED ORDER — PREDNISONE 10 MG PO TABS: ORAL_TABLET | ORAL | 0 refills | Status: DC

## 2018-06-25 NOTE — Patient Instructions (Signed)
Get x-rays downstairs as you leave today. We will put in an order for physical therapy pending these results. Continue the diclofenac, flexeril, and norco as you have been. Consider prednisone dose pack. Follow up with me about 1 month after starting therapy. If not improving will consider MRI.

## 2018-06-28 ENCOUNTER — Encounter: Payer: Self-pay | Admitting: Family Medicine

## 2018-06-28 NOTE — Addendum Note (Signed)
Addended by: Kathi Simpers F on: 06/28/2018 03:11 PM   Modules accepted: Orders

## 2018-06-28 NOTE — Progress Notes (Signed)
PCP: Arliss Journey, PA-C  Subjective:   HPI: Patient is a 53 y.o. female here for right shoulder/neck pain.  Patient reports she had improvement after last visit. Current pain started several weeks ago. Pain 5/10 and sharp, worse at night. Goes into posterior shoulders. No numbness or tingling. No radiation into extremities. No bowel/bladder dysfunction. Takes norco for low back Took voltaren, nitro patches for right shoulder previously. Takes gabapentin as needed at nighttime.  History reviewed. No pertinent past medical history.  Current Outpatient Medications on File Prior to Visit  Medication Sig Dispense Refill  . cetirizine (ZYRTEC) 10 MG tablet TK 1 T PO QD  0  . Cholecalciferol 50000 units TABS Take by mouth.    . cyanocobalamin (,VITAMIN B-12,) 1000 MCG/ML injection Inject into the muscle.    . cyclobenzaprine (FLEXERIL) 10 MG tablet TK 1 T PO Q 8 H PRN  2  . diclofenac (VOLTAREN) 75 MG EC tablet Take 1 tablet (75 mg total) 2 (two) times daily by mouth. 60 tablet 1  . diclofenac sodium (VOLTAREN) 1 % GEL Apply topically 4 (four) times daily.    Marland Kitchen esomeprazole (NEXIUM) 40 MG capsule TK 1 C PO EVERY EVE  0  . fluticasone (FLONASE) 50 MCG/ACT nasal spray Place 2 sprays into both nostrils daily. 16 g 0  . gabapentin (NEURONTIN) 300 MG capsule     . hydrochlorothiazide (HYDRODIURIL) 25 MG tablet TK 1 T PO ONCE D  0  . HYDROcodone-acetaminophen (NORCO) 10-325 MG tablet TK 1 T PO Q 8 H PRN  0  . LORazepam (ATIVAN) 0.5 MG tablet Take 0.5 mg by mouth.    . nitroGLYCERIN (NITRODUR - DOSED IN MG/24 HR) 0.2 mg/hr patch Apply 1/4th patch to affected shoulder, change daily 30 patch 1  . nystatin cream (MYCOSTATIN) Apply topically.    Marland Kitchen rOPINIRole (REQUIP) 0.25 MG tablet     . TRANSDERM-SCOP, 1.5 MG, 1 MG/3DAYS UNW AND APP 1 PA TO SKIN Q 3 DAYS.  1   No current facility-administered medications on file prior to visit.     Past Surgical History:  Procedure Laterality Date  .  ABDOMINAL HYSTERECTOMY    . BREAST REDUCTION SURGERY    . FOOT SURGERY    . GASTRIC BYPASS    . TUBAL LIGATION      No Known Allergies  Social History   Socioeconomic History  . Marital status: Single    Spouse name: Not on file  . Number of children: Not on file  . Years of education: Not on file  . Highest education level: Not on file  Occupational History  . Not on file  Social Needs  . Financial resource strain: Not on file  . Food insecurity:    Worry: Not on file    Inability: Not on file  . Transportation needs:    Medical: Not on file    Non-medical: Not on file  Tobacco Use  . Smoking status: Never Smoker  . Smokeless tobacco: Never Used  Substance and Sexual Activity  . Alcohol use: No  . Drug use: No  . Sexual activity: Yes    Birth control/protection: Surgical  Lifestyle  . Physical activity:    Days per week: Not on file    Minutes per session: Not on file  . Stress: Not on file  Relationships  . Social connections:    Talks on phone: Not on file    Gets together: Not on file    Attends  religious service: Not on file    Active member of club or organization: Not on file    Attends meetings of clubs or organizations: Not on file    Relationship status: Not on file  . Intimate partner violence:    Fear of current or ex partner: Not on file    Emotionally abused: Not on file    Physically abused: Not on file    Forced sexual activity: Not on file  Other Topics Concern  . Not on file  Social History Narrative  . Not on file    History reviewed. No pertinent family history.  BP 110/87   Pulse 96   Ht 5\' 6"  (1.676 m)   Wt 210 lb (95.3 kg)   BMI 33.89 kg/m   Review of Systems: See HPI above.     Objective:  Physical Exam:  Gen: NAD, comfortable in exam room  Neck: No gross deformity, swelling, bruising. TTP bilateral paraspinal cervical regions.  No midline/bony TTP. Extension limited to 10 degrees.  Full flexion and lateral  rotations. BUE strength 5/5.   Sensation intact to light touch.   2+ equal reflexes in triceps, biceps, brachioradialis tendons. Negative spurlings. NV intact distal BUEs.  Right shoulder: No swelling, ecchymoses.  No gross deformity. No TTP. FROM. Strength 5/5 with empty can and resisted internal/external rotation. NV intact distally.   Assessment & Plan:  1. Neck pain - independently reviewed radiographs - no acute abnormalities but multilevel degenerative changes noted.  Pain consistent with combination of this and cervical strain.  Prednisone dose pack, flexeril, norco.  Start physical therapy.  Diclofenac when done with prednisone.  F/u in 1 month.

## 2019-07-21 ENCOUNTER — Ambulatory Visit: Payer: Medicare Other

## 2019-07-21 ENCOUNTER — Ambulatory Visit: Payer: Medicare Other | Attending: Internal Medicine

## 2019-07-21 DIAGNOSIS — Z23 Encounter for immunization: Secondary | ICD-10-CM

## 2019-07-21 NOTE — Progress Notes (Signed)
   Covid-19 Vaccination Clinic  Name:  JADENCE KINLAW    MRN: [id number] DOB: [birth year]  07/21/2019  Ms. Servellon was observed post Covid-19 immunization for 15 minutes without incident. She was provided with Vaccine Information Sheet and instruction to access the V-Safe system.   Ms. Jerrett was instructed to call 911 with any severe reactions post vaccine: Marland Kitchen Difficulty breathing  . Swelling of face and throat  . A fast heartbeat  . A bad rash all over body  . Dizziness and weakness   Immunizations Administered    Name Date Dose VIS Date Route   Pfizer COVID-19 Vaccine 07/21/2019 11:26 AM 0.3 mL 04/15/2019 Intramuscular   Manufacturer: [employer], [employer]   Lot: [id number]   NDC: [address]-4

## 2019-07-25 ENCOUNTER — Ambulatory Visit: Payer: Medicare Other

## 2019-08-16 ENCOUNTER — Ambulatory Visit: Payer: Medicare Other | Attending: Internal Medicine

## 2019-08-16 DIAGNOSIS — Z23 Encounter for immunization: Secondary | ICD-10-CM

## 2019-08-16 NOTE — Progress Notes (Signed)
   Covid-19 Vaccination Clinic  Name:  [name] Steele    MRN: [id number] DOB: [birth year]  08/16/2019  [name] Steele was observed post Covid-19 immunization for 15 minutes without incident. She was provided with Vaccine Information Sheet and instruction to access the V-Safe system.   [name] Steele was instructed to call 911 with any severe reactions post vaccine: Marland Kitchen Difficulty breathing  . Swelling of face and throat  . A fast heartbeat  . A bad rash all over body  . Dizziness and weakness   Immunizations Administered    Name Date Dose VIS Date Route   Pfizer COVID-19 Vaccine 08/16/2019 11:05 AM 0.3 mL 04/15/2019 Intramuscular   Manufacturer: [employer], [employer]   Lot: [id number]   NDC: [address]-5

## 2020-11-28 LAB — COLONOSCOPY WITH ESOPHAGOGASTRODUODENOSCOPY (EGD)

## 2021-04-07 ENCOUNTER — Emergency Department (HOSPITAL_BASED_OUTPATIENT_CLINIC_OR_DEPARTMENT_OTHER): Payer: Medicare Other

## 2021-04-07 ENCOUNTER — Other Ambulatory Visit: Payer: Self-pay

## 2021-04-07 ENCOUNTER — Emergency Department (HOSPITAL_BASED_OUTPATIENT_CLINIC_OR_DEPARTMENT_OTHER)
Admission: EM | Admit: 2021-04-07 | Discharge: 2021-04-07 | Disposition: A | Discharge status: Home / Self Care | Payer: Medicare Other | Attending: Emergency Medicine | Admitting: Emergency Medicine

## 2021-04-07 ENCOUNTER — Encounter (HOSPITAL_BASED_OUTPATIENT_CLINIC_OR_DEPARTMENT_OTHER): Payer: Self-pay

## 2021-04-07 DIAGNOSIS — R0981 Nasal congestion: Secondary | ICD-10-CM | POA: Insufficient documentation

## 2021-04-07 DIAGNOSIS — M25561 Pain in right knee: Secondary | ICD-10-CM | POA: Insufficient documentation

## 2021-04-07 DIAGNOSIS — J3489 Other specified disorders of nose and nasal sinuses: Secondary | ICD-10-CM | POA: Diagnosis not present

## 2021-04-07 DIAGNOSIS — Z20822 Contact with and (suspected) exposure to covid-19: Secondary | ICD-10-CM | POA: Insufficient documentation

## 2021-04-07 DIAGNOSIS — W19XXXA Unspecified fall, initial encounter: Secondary | ICD-10-CM | POA: Insufficient documentation

## 2021-04-07 LAB — RESP PANEL BY RT-PCR (FLU A&B, COVID) ARPGX2
Influenza A by PCR: NEGATIVE
Influenza B by PCR: NEGATIVE
SARS Coronavirus 2 by RT PCR: NEGATIVE

## 2021-04-07 MED ORDER — FLUTICASONE PROPIONATE 50 MCG/ACT NA SUSP: 2.0000 | Freq: Every day | NASAL | 0 refills | Status: DC

## 2021-04-07 NOTE — ED Notes (Signed)
Patient discharged to home.  All discharge instructions reviewed.  Patient verbalized understanding via teachback method.  VS WDL.  Respirations even and unlabored.  Ambulatory out of ED.   °

## 2021-04-07 NOTE — ED Triage Notes (Signed)
Pt states she fell 3 weeks ago, continues to have right knee pain, states improved. Also c/o congestion, cough since Thursday

## 2021-04-07 NOTE — Discharge Instructions (Addendum)
Your respiratory panel was negative for COVID and flu.  Knee x-ray was negative.  This is still likely a viral illness that resolved on its own.  I will prescribe you fluticasone nasal spray which should help with your congestion.  Please take 600 mg of ibuprofen every 6 hours for knee pain.  Please follow-up with your primary care provider.   Please return to the emergency department sooner if you experience worsening nasal congestion, trouble breathing, trouble swallowing, trouble talking, inability to walk, worsening knee pain, or any other concerns you may have.

## 2021-04-07 NOTE — ED Provider Notes (Signed)
[facility] EMERGENCY DEPARTMENT Provider Note   CSN: [id number] Arrival date & time: 04/07/21  1952     History Chief Complaint  Patient presents with   Nasal Congestion    [name] Steele is a 55 y.o. female who presents the emergency department with a 4-day history of nasal congestion and rhinorrhea.  Patient states that she was at a party and a lot of people were sick.  She denies any fever, chills, abdominal pain, nausea, vomiting, diarrhea, cough, shortness of breath.  Patient also complains of right knee pain that has been ongoing since a fall that occurred 3 weeks ago.  Her knee pain is worse with movement.  Currently rates her knee pain moderate in severity.  HPI     History reviewed. No pertinent past medical history.  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     History reviewed. No pertinent family history.  Social History   Tobacco Use   Smoking status: Never   Smokeless tobacco: Never  Substance Use Topics   Alcohol use: No   Drug use: No    Home Medications Prior to Admission medications   Medication Sig Start Date End Date Taking? Authorizing Provider  fluticasone (FLONASE) 50 MCG/ACT nasal spray Place 2 sprays into both nostrils daily. 04/07/21  Yes Meredeth Ide, Kevan Prouty M, PA-C  cetirizine (ZYRTEC) 10 MG tablet TK 1 T PO QD 09/30/16   [provider]   Cholecalciferol 50000 units TABS Take by mouth.    [provider]  cyanocobalamin (,VITAMIN B-12,) 1000 MCG/ML injection Inject into the muscle. 12/01/16   [provider]  cyclobenzaprine (FLEXERIL) 10 MG tablet TK 1 T PO Q 8 H PRN 03/03/17   [provider]  diclofenac (VOLTAREN) 75 MG EC tablet Take 1 tablet (75 mg total) 2 (two) times daily by mouth. 03/20/17   Hudnall, Azucena Fallen, MD  diclofenac sodium (VOLTAREN) 1 % GEL Apply topically 4 (four) times daily.    [provider]  esomeprazole (NEXIUM) 40 MG capsule TK 1 C PO EVERY EVE 10/13/16   [provider]  gabapentin (NEURONTIN) 300 MG capsule  12/24/16   [provider]  hydrochlorothiazide (HYDRODIURIL) 25 MG tablet TK 1 T PO ONCE D 10/02/16   [provider]  HYDROcodone-acetaminophen (NORCO) 10-325 MG tablet TK 1 T PO Q 8 H PRN 03/04/17   [provider]  LORazepam (ATIVAN) 0.5 MG tablet Take 0.5 mg by mouth. 12/26/16   [provider]  nitroGLYCERIN (NITRODUR - DOSED IN MG/24 HR) 0.2 mg/hr patch Apply 1/4th patch to affected shoulder, change daily 03/20/17   Hudnall, Azucena Fallen, MD  nystatin cream (MYCOSTATIN) Apply topically. 08/06/16   [provider]  predniSONE (DELTASONE) 10 MG tablet 6 tabs po day 1, 5 tabs po day 2, 4 tabs po day  3, 3 tabs po day 4, 2 tabs po day 5, 1 tab po day 6 06/25/18   Hudnall, Azucena Fallen, MD  rOPINIRole (REQUIP) 0.25 MG tablet  08/22/16   [provider]  TRANSDERM-SCOP, 1.5 MG, 1 MG/3DAYS UNW AND APP 1 PA TO SKIN Q 3 DAYS. 12/11/16   [provider]    Allergies    Patient has no known allergies.  Review of Systems   Review of Systems  All other systems reviewed and are negative.  Physical Exam Updated Vital Signs BP 137/79 (BP Location: Right Arm)   Pulse 81   Temp 98.7 F (37.1 C) (Oral)   Resp 16   Ht 5\' 5"  (1.651 m)   Wt 90.7 kg   SpO2 97%   BMI 33.28 kg/m   Physical Exam Vitals and nursing  note reviewed.  Constitutional:      Appearance: Normal appearance.  HENT:     Head: Normocephalic and atraumatic.     Right Ear: Tympanic membrane and ear canal normal.     Left Ear: Tympanic membrane and ear canal normal.     Ears:     Comments: Bilateral PE tubes    Nose: Congestion present. No rhinorrhea.     Mouth/Throat:     Mouth: Mucous membranes are moist.     Pharynx: Oropharynx is clear. No oropharyngeal exudate.  Eyes:     General:        Right eye: No discharge.        Left eye: No discharge.     Conjunctiva/sclera: Conjunctivae normal.  Cardiovascular:     Rate and Rhythm: Normal rate and regular rhythm.     Pulses: Normal pulses.  Pulmonary:     Effort: Pulmonary effort is normal. No respiratory distress.     Breath sounds: Normal breath sounds. No stridor. No wheezing, rhonchi or rales.  Abdominal:     General: Abdomen is flat. Bowel sounds are normal.     Tenderness: There is no abdominal tenderness.  Musculoskeletal:     Cervical back: Neck supple.     Comments: Patient has full range of motion the knee.  Knee is nontender to palpation.  There is mild amount of swelling.  Questionable step-off.  Skin:    General: Skin is warm and dry.     Findings: No rash.  Neurological:     General: No focal deficit present.     Mental Status: She is alert.  Psychiatric:        Mood and Affect: Mood normal.        Behavior: Behavior normal.    ED Results / Procedures / Treatments   Labs (all labs ordered are listed, but only abnormal results are displayed) Labs Reviewed  RESP PANEL BY RT-PCR (FLU A&B, COVID) [id number]    EKG None  Radiology DG Knee Complete 4 Views Right  Result Date: 04/07/2021 CLINICAL DATA:  Right knee pain after fall 3 weeks ago EXAM: RIGHT KNEE - COMPLETE 4+ VIEW COMPARISON:  None. FINDINGS: No evidence of fracture, dislocation, or joint effusion. Tricompartment degenerative change. Patellar enthesophytes. Soft tissues are unremarkable.  IMPRESSION: No evidence of fracture or dislocation. Tricompartment DJD. Electronically Signed   By: 14/08/2020 M.D.   On: 04/07/2021 21:06    Procedures Procedures   Medications Ordered in ED Medications - No data to display  ED Course  I have reviewed the triage vital signs and the nursing notes.  Pertinent labs & imaging results  that were available during my care of the patient were reviewed by me and considered in my medical decision making (see chart for details).    MDM Rules/Calculators/A&P                          [name] Steele is a 55 y.o. female who presents the emergency department with couple complaints.  With regards to nasal congestion seems like a viral illness.  Will swab for COVID and flu.  With regards the knee pain will get a knee x-ray.  Respiratory panel was negative for COVID and flu.  Knee x-ray was negative.  Still likely a viral illness that resolved on its own.  Patient is ambulatory with regards to the knee.  Over-the-counter remedies were discussed at length with the patient.  She is safe for discharge. Strict return precautions given.    Final Clinical Impression(s) / ED Diagnoses Final diagnoses:  Right knee pain, unspecified chronicity  Nasal congestion    Rx / DC Orders ED Discharge Orders          Ordered    fluticasone (FLONASE) 50 MCG/ACT nasal spray  Daily        04/07/21 2128             Honor Loh [place], [place] 04/07/21 2130    Alvira Monday, MD 04/08/21 1435

## 2021-05-05 DIAGNOSIS — I219 Acute myocardial infarction, unspecified: Secondary | ICD-10-CM

## 2021-05-05 HISTORY — DX: Acute myocardial infarction, unspecified: I21.9

## 2021-12-23 ENCOUNTER — Encounter (HOSPITAL_BASED_OUTPATIENT_CLINIC_OR_DEPARTMENT_OTHER): Payer: Self-pay | Admitting: Emergency Medicine

## 2021-12-23 ENCOUNTER — Emergency Department (HOSPITAL_BASED_OUTPATIENT_CLINIC_OR_DEPARTMENT_OTHER)
Admission: EM | Admit: 2021-12-23 | Discharge: 2021-12-23 | Disposition: A | Discharge status: Home / Self Care | Payer: Medicare Other | Attending: Emergency Medicine | Admitting: Emergency Medicine

## 2021-12-23 DIAGNOSIS — X58XXXA Exposure to other specified factors, initial encounter: Secondary | ICD-10-CM | POA: Diagnosis not present

## 2021-12-23 DIAGNOSIS — Z760 Encounter for issue of repeat prescription: Secondary | ICD-10-CM | POA: Diagnosis not present

## 2021-12-23 DIAGNOSIS — S8012XA Contusion of left lower leg, initial encounter: Secondary | ICD-10-CM | POA: Insufficient documentation

## 2021-12-23 DIAGNOSIS — S8992XA Unspecified injury of left lower leg, initial encounter: Secondary | ICD-10-CM | POA: Diagnosis present

## 2021-12-23 DIAGNOSIS — T148XXA Other injury of unspecified body region, initial encounter: Secondary | ICD-10-CM

## 2021-12-23 HISTORY — DX: Essential (primary) hypertension: I10

## 2021-12-23 LAB — CBC WITH DIFFERENTIAL/PLATELET
Abs Immature Granulocytes: 0.03 10*3/uL (ref 0.00–0.07)
Basophils Absolute: 0 10*3/uL (ref 0.0–0.1)
Basophils Relative: 0 %
Eosinophils Absolute: 0 10*3/uL (ref 0.0–0.5)
Eosinophils Relative: 0 %
HCT: 34.6 % — ABNORMAL LOW (ref 36.0–46.0)
Hemoglobin: 11 g/dL — ABNORMAL LOW (ref 12.0–15.0)
Immature Granulocytes: 0 %
Lymphocytes Relative: 21 %
Lymphs Abs: 1.7 10*3/uL (ref 0.7–4.0)
MCH: 28 pg (ref 26.0–34.0)
MCHC: 31.8 g/dL (ref 30.0–36.0)
MCV: 88 fL (ref 80.0–100.0)
Monocytes Absolute: 0.6 10*3/uL (ref 0.1–1.0)
Monocytes Relative: 7 %
Neutro Abs: 5.7 10*3/uL (ref 1.7–7.7)
Neutrophils Relative %: 72 %
Platelets: 370 10*3/uL (ref 150–400)
RBC: 3.93 MIL/uL (ref 3.87–5.11)
RDW: 13.9 % (ref 11.5–15.5)
WBC: 8 10*3/uL (ref 4.0–10.5)
nRBC: 0 % (ref 0.0–0.2)

## 2021-12-23 LAB — BASIC METABOLIC PANEL
Anion gap: 7 (ref 5–15)
BUN: 16 mg/dL (ref 6–20)
CO2: 23 mmol/L (ref 22–32)
Calcium: 8.4 mg/dL — ABNORMAL LOW (ref 8.9–10.3)
Chloride: 108 mmol/L (ref 98–111)
Creatinine, Ser: 1.12 mg/dL — ABNORMAL HIGH (ref 0.44–1.00)
GFR, Estimated: 58 mL/min — ABNORMAL LOW (ref >60)
Glucose, Bld: 134 mg/dL — ABNORMAL HIGH (ref 70–99)
Potassium: 3.6 mmol/L (ref 3.5–5.1)
Sodium: 138 mmol/L (ref 135–145)

## 2021-12-23 MED ORDER — METOPROLOL SUCCINATE ER 25 MG PO TB24: 25.0000 mg | ORAL_TABLET | Freq: Every day | ORAL | 1 refills | Status: DC

## 2021-12-23 MED ORDER — AMLODIPINE BESYLATE 5 MG PO TABS: 5.0000 mg | ORAL_TABLET | Freq: Every day | ORAL | 1 refills | Status: DC

## 2021-12-23 NOTE — Discharge Instructions (Addendum)
Follow-up with the hematologist Tasha these discharge papers about your bruising.  You may also speak with your PCP about this.  They are both able to do further lab work.  I have refilled your medications.  Please be sure to take these to try and prevent any other heart conditions.  It was a pleasure to meet you, return with any worsening symptoms I hope that you feel better!

## 2021-12-23 NOTE — ED Triage Notes (Signed)
Pt reports unexplained bruising x 1 week. Concerned for "blood clotting condition".

## 2021-12-23 NOTE — ED Provider Notes (Signed)
[facility] EMERGENCY DEPARTMENT Provider Note   CSN: [id number] Arrival date & time: 12/23/21  1911     History No chief complaint on file.   [name] Steele is a 56 y.o. female with a past medical history of vitamin D and vitamin B12 deficiency presenting today with increased bruising.  Says that she has been noting it over the past few weeks.  She is unsure if she has a "blood clot problem."  Is not on any blood thinners but recently started on aspirin for an NSTEMI a month ago.  Reports that the bruising happens even if she is not bumping up against anything.  HPI     Home Medications Prior to Admission medications   Medication Sig Start Date End Date Taking? Authorizing Provider  amLODipine (NORVASC) 5 MG tablet Take 1 tablet (5 mg total) by mouth daily. 12/23/21  Yes Yeva Bissette A, PA-C  metoprolol succinate (TOPROL-XL) 25 MG 24 hr tablet Take 1 tablet (25 mg total) by mouth daily. 12/23/21  Yes Joncarlo Friberg A, PA-C  cetirizine (ZYRTEC) 10 MG tablet TK 1 T PO QD 09/30/16   [provider]  Cholecalciferol 50000 units TABS Take by mouth.    [provider]  cyanocobalamin (,VITAMIN B-12,) 1000 MCG/ML injection Inject into the muscle. 12/01/16   [provider]  cyclobenzaprine (FLEXERIL) 10 MG tablet TK 1 T PO Q 8 H PRN 03/03/17   [provider]  diclofenac (VOLTAREN) 75 MG EC tablet Take 1 tablet (75 mg total) 2 (two) times daily by mouth. 03/20/17   Hudnall, Azucena Fallen, MD  diclofenac sodium (VOLTAREN) 1 % GEL Apply topically 4 (four) times daily.    [provider]  esomeprazole (NEXIUM) 40 MG capsule TK 1 C PO EVERY EVE 10/13/16   [provider]  fluticasone (FLONASE) 50 MCG/ACT nasal spray Place 2 sprays into both nostrils daily. 04/07/21   Teressa Lower, PA-C  gabapentin (NEURONTIN) 300 MG capsule  12/24/16   [provider]  hydrochlorothiazide (HYDRODIURIL) 25 MG tablet TK 1 T PO ONCE D  10/02/16   [provider]  HYDROcodone-acetaminophen (NORCO) 10-325 MG tablet TK 1 T PO Q 8 H PRN 03/04/17   [provider]  LORazepam (ATIVAN) 0.5 MG tablet Take 0.5 mg by mouth. 12/26/16   [provider]  nitroGLYCERIN (NITRODUR - DOSED IN MG/24 HR) 0.2 mg/hr patch Apply 1/4th patch to affected shoulder, change daily 03/20/17   Hudnall, Azucena Fallen, MD  nystatin cream (MYCOSTATIN) Apply topically. 08/06/16   [provider]  predniSONE (DELTASONE) 10 MG tablet 6 tabs po day 1, 5 tabs po day 2, 4 tabs po day 3, 3 tabs po day 4, 2 tabs po day 5, 1 tab po day 6 06/25/18   Hudnall, Azucena Fallen, MD  rOPINIRole (REQUIP) 0.25 MG tablet  08/22/16   [provider]  TRANSDERM-SCOP, 1.5 MG, 1 MG/3DAYS UNW AND APP 1 PA TO SKIN Q 3 DAYS. 12/11/16   [provider]      Allergies    Patient has no known allergies.    Review of Systems   Review of Systems  Physical Exam Updated Vital Signs BP (!) 149/79 (BP Location: Left Arm)   Pulse 85   Temp 98.1 F (36.7 C) (Oral)   Resp 18   SpO2 96%  Physical Exam Vitals and nursing note reviewed.  Constitutional:      Appearance: Normal appearance.  HENT:  Head: Normocephalic and atraumatic.  Eyes:     General: No scleral icterus.    Conjunctiva/sclera: Conjunctivae normal.  Cardiovascular:     Pulses: Normal pulses.  Pulmonary:     Effort: Pulmonary effort is normal. No respiratory distress.  Skin:    General: Skin is warm and dry.     Findings: No rash.     Comments: Large bruise to the left lower extremity just medial to the shin.  Additional small bruising noted throughout the lower extremities.  No petechia or purpura  Neurological:     Mental Status: She is alert.  Psychiatric:        Mood and Affect: Mood normal.     ED Results / Procedures / Treatments   Labs (all labs ordered are listed, but only abnormal results are displayed) Labs Reviewed  CBC WITH DIFFERENTIAL/PLATELET - Abnormal;  Notable for the following components:      Result Value   Hemoglobin 11.0 (*)    HCT 34.6 (*)    All other components within normal limits  BASIC METABOLIC PANEL - Abnormal; Notable for the following components:   Glucose, Bld 134 (*)    Creatinine, Ser 1.12 (*)    Calcium 8.4 (*)    GFR, Estimated 58 (*)    All other components within normal limits    EKG None  Radiology No results found.  Procedures Procedures   Medications Ordered in ED Medications - No data to display  ED Course/ Medical Decision Making/ A&P                           Medical Decision Making Amount and/or Complexity of Data Reviewed Labs: ordered.  Risk Prescription drug management.   56 year old female presenting with concern for bruising.  Says that she has been having bruises pop up for the past few weeks even when she does not remember hitting anything.  Differential includes but is not limited to coagulopathy, medication side effect, vasculitis, physical abuse or trauma while under the influence.  This is not an exhaustive differential.  Physical exam: Multiple stages of different ages to the bilateral lower extremities.  Small bruises noted to upper extremities as well.  Work-up: CBC with normal platelets.  Hemoglobin 11, around baseline from hospitalization after STEMI in July.  MDM/disposition: Patient has a normal platelet count.  Other blood counts also nonconcerning.  She started aspirin after her NSTEMI in July so it is possible that this is causing her to bruise easily but should not cause atraumatic bruising.  Ultimately she should follow-up with hematology for these concerns.  She does report being out of her metoprolol and amlodipine which were prescribed after NSTEMI.  I refilled these and suggest that she follow-up with her PCP as well.  She is agreeable to the plan.  Vital signs stable and well-appearing on exam.  Discharged at this time.     Final Clinical Impression(s) / ED  Diagnoses Final diagnoses:  Bruising    Rx / DC Orders ED Discharge Orders          Ordered    amLODipine (NORVASC) 5 MG tablet  Daily        12/23/21 2101    metoprolol succinate (TOPROL-XL) 25 MG 24 hr tablet  Daily        12/23/21 2101           Results and diagnoses were explained to the patient. Return precautions discussed in  full. Patient had no additional questions and expressed complete understanding.   This chart was dictated using voice recognition software.  Despite best efforts to proofread,  errors can occur which can change the documentation meaning.    Darliss Ridgel 12/23/21 2112    Tegeler, Gwenyth Allegra, MD 12/23/21 [phone]

## 2021-12-25 ENCOUNTER — Inpatient Hospital Stay (HOSPITAL_BASED_OUTPATIENT_CLINIC_OR_DEPARTMENT_OTHER): Payer: Medicare Other | Admitting: Family

## 2021-12-25 ENCOUNTER — Inpatient Hospital Stay: Payer: Medicare Other | Attending: Family

## 2021-12-25 ENCOUNTER — Encounter: Payer: Self-pay | Admitting: Family

## 2021-12-25 ENCOUNTER — Other Ambulatory Visit: Payer: Self-pay | Admitting: Family

## 2021-12-25 DIAGNOSIS — T148XXA Other injury of unspecified body region, initial encounter: Secondary | ICD-10-CM

## 2021-12-25 DIAGNOSIS — D509 Iron deficiency anemia, unspecified: Secondary | ICD-10-CM

## 2021-12-25 DIAGNOSIS — Z79899 Other long term (current) drug therapy: Secondary | ICD-10-CM | POA: Insufficient documentation

## 2021-12-25 DIAGNOSIS — Z7982 Long term (current) use of aspirin: Secondary | ICD-10-CM | POA: Diagnosis not present

## 2021-12-25 DIAGNOSIS — R233 Spontaneous ecchymoses: Secondary | ICD-10-CM | POA: Diagnosis not present

## 2021-12-25 DIAGNOSIS — Z9884 Bariatric surgery status: Secondary | ICD-10-CM | POA: Diagnosis not present

## 2021-12-25 DIAGNOSIS — I252 Old myocardial infarction: Secondary | ICD-10-CM | POA: Diagnosis not present

## 2021-12-25 LAB — CBC WITH DIFFERENTIAL (CANCER CENTER ONLY)
Abs Immature Granulocytes: 0.11 10*3/uL — ABNORMAL HIGH (ref 0.00–0.07)
Basophils Absolute: 0 10*3/uL (ref 0.0–0.1)
Basophils Relative: 0 %
Eosinophils Absolute: 0 10*3/uL (ref 0.0–0.5)
Eosinophils Relative: 1 %
HCT: 36.2 % (ref 36.0–46.0)
Hemoglobin: 11 g/dL — ABNORMAL LOW (ref 12.0–15.0)
Immature Granulocytes: 1 %
Lymphocytes Relative: 20 %
Lymphs Abs: 1.8 10*3/uL (ref 0.7–4.0)
MCH: 27.3 pg (ref 26.0–34.0)
MCHC: 30.4 g/dL (ref 30.0–36.0)
MCV: 89.8 fL (ref 80.0–100.0)
Monocytes Absolute: 0.5 10*3/uL (ref 0.1–1.0)
Monocytes Relative: 5 %
Neutro Abs: 6.2 10*3/uL (ref 1.7–7.7)
Neutrophils Relative %: 73 %
Platelet Count: 345 10*3/uL (ref 150–400)
RBC: 4.03 MIL/uL (ref 3.87–5.11)
RDW: 13.6 % (ref 11.5–15.5)
WBC Count: 8.6 10*3/uL (ref 4.0–10.5)
nRBC: 0 % (ref 0.0–0.2)

## 2021-12-25 LAB — RETICULOCYTES
Immature Retic Fract: 23.1 % — ABNORMAL HIGH (ref 2.3–15.9)
RBC.: 3.96 MIL/uL (ref 3.87–5.11)
Retic Count, Absolute: 88.7 10*3/uL (ref 19.0–186.0)
Retic Ct Pct: 2.2 % (ref 0.4–3.1)

## 2021-12-25 LAB — CMP (CANCER CENTER ONLY)
ALT: 27 U/L (ref 0–44)
AST: 23 U/L (ref 15–41)
Albumin: 4.2 g/dL (ref 3.5–5.0)
Alkaline Phosphatase: 87 U/L (ref 38–126)
Anion gap: 8 (ref 5–15)
BUN: 14 mg/dL (ref 6–20)
CO2: 26 mmol/L (ref 22–32)
Calcium: 9.2 mg/dL (ref 8.9–10.3)
Chloride: 104 mmol/L (ref 98–111)
Creatinine: 0.73 mg/dL (ref 0.44–1.00)
GFR, Estimated: >60 mL/min (ref >60)
Glucose, Bld: 157 mg/dL — ABNORMAL HIGH (ref 70–99)
Potassium: 3.9 mmol/L (ref 3.5–5.1)
Sodium: 138 mmol/L (ref 135–145)
Total Bilirubin: 0.5 mg/dL (ref 0.3–1.2)
Total Protein: 7.2 g/dL (ref 6.5–8.1)

## 2021-12-25 LAB — LACTATE DEHYDROGENASE: LDH: 239 U/L — ABNORMAL HIGH (ref 98–192)

## 2021-12-25 LAB — FERRITIN: Ferritin: 5 ng/mL — ABNORMAL LOW (ref 11–307)

## 2021-12-25 LAB — SAVE SMEAR(SSMR), FOR PROVIDER SLIDE REVIEW

## 2021-12-25 NOTE — Progress Notes (Unsigned)
Hematology/Oncology Consultation   Name: [name] Steele      MRN: [id number]    Location: Room/bed info not found  Date: 12/25/2021 Time:3:26 PM   REFERRING PHYSICIAN: Lynden Oxford, MD  REASON FOR CONSULT: Bruising   DIAGNOSIS:  Mild anemia Bruising   HISTORY OF PRESENT ILLNESS: [name] is a very pleasant 56 yo African American female with mild anemia as well as   ROS: All other 10 point review of systems is negative.   PAST MEDICAL HISTORY:   Past Medical History:  Diagnosis Date   Hypertension     ALLERGIES: No Known Allergies    MEDICATIONS:  Current Outpatient Medications on File Prior to Visit  Medication Sig Dispense Refill   amLODipine (NORVASC) 5 MG tablet Take 1 tablet (5 mg total) by mouth daily. 30 tablet 1   cetirizine (ZYRTEC) 10 MG tablet TK 1 T PO QD  0   Cholecalciferol 50000 units TABS Take by mouth.     cyanocobalamin (,VITAMIN B-12,) 1000 MCG/ML injection Inject into the muscle.     cyclobenzaprine (FLEXERIL) 10 MG tablet TK 1 T PO Q 8 H PRN  2   diclofenac (VOLTAREN) 75 MG EC tablet Take 1 tablet (75 mg total) 2 (two) times daily by mouth. 60 tablet 1   diclofenac sodium (VOLTAREN) 1 % GEL Apply topically 4 (four) times daily.     esomeprazole (NEXIUM) 40 MG capsule TK 1 C PO EVERY EVE  0   fluticasone (FLONASE) 50 MCG/ACT nasal spray Place 2 sprays into both nostrils daily. 15.8 mL 0   gabapentin (NEURONTIN) 300 MG capsule      hydrochlorothiazide (HYDRODIURIL) 25 MG tablet TK 1 T PO ONCE D  0   HYDROcodone-acetaminophen (NORCO) 10-325 MG tablet TK 1 T PO Q 8 H PRN  0   LORazepam (ATIVAN) 0.5 MG tablet Take 0.5 mg by mouth.     metoprolol succinate (TOPROL-XL) 25 MG 24 hr tablet Take 1 tablet (25 mg total) by mouth daily. 30 tablet 1   nitroGLYCERIN (NITRODUR - DOSED IN MG/24 HR) 0.2 mg/hr patch Apply 1/4th patch to affected shoulder, change daily 30 patch 1   nystatin cream (MYCOSTATIN) Apply topically.     predniSONE (DELTASONE) 10 MG  tablet 6 tabs po day 1, 5 tabs po day 2, 4 tabs po day 3, 3 tabs po day 4, 2 tabs po day 5, 1 tab po day 6 21 tablet 0   rOPINIRole (REQUIP) 0.25 MG tablet      TRANSDERM-SCOP, 1.5 MG, 1 MG/3DAYS UNW AND APP 1 PA TO SKIN Q 3 DAYS.  1   No current facility-administered medications on file prior to visit.     PAST SURGICAL HISTORY Past Surgical History:  Procedure Laterality Date   ABDOMINAL HYSTERECTOMY     BREAST REDUCTION SURGERY     FOOT SURGERY     GASTRIC BYPASS     TUBAL LIGATION      FAMILY HISTORY: No family history on file.  SOCIAL HISTORY:  reports that she has never smoked. She has never used smokeless tobacco. She reports that she does not drink alcohol and does not use drugs.  PERFORMANCE STATUS: The patient's performance status is 1 - Symptomatic but completely ambulatory  PHYSICAL EXAM: Most Recent Vital Signs: Blood pressure 123/78, pulse 87, temperature 98.3 F (36.8 C), temperature source Oral, resp. rate 17, weight 176 lb (79.8 kg), SpO2 100 %. BP 123/78 (BP Location: Left Arm, Patient Position: Sitting)  Pulse 87   Temp 98.3 F (36.8 C) (Oral)   Resp 17   Wt 176 lb (79.8 kg)   SpO2 100%   BMI 29.29 kg/m   General Appearance:    Alert, cooperative, no distress, appears stated age  Head:    Normocephalic, without obvious abnormality, atraumatic  Eyes:    PERRL, conjunctiva/corneas clear, EOM's intact, fundi    benign, both eyes        Throat:   Lips, mucosa, and tongue normal; teeth and gums normal  Neck:   Supple, symmetrical, trachea midline, no adenopathy;    thyroid:  no enlargement/tenderness/nodules; no carotid   bruit or JVD  Back:     Symmetric, no curvature, ROM normal, no CVA tenderness  Lungs:     Clear to auscultation bilaterally, respirations unlabored  Chest Wall:    No tenderness or deformity   Heart:    Regular rate and rhythm, S1 and S2 normal, no murmur, rub   or gallop     Abdomen:     Soft, non-tender, bowel sounds active all  four quadrants,    no masses, no organomegaly        Extremities:   Extremities normal, atraumatic, no cyanosis or edema  Pulses:   2+ and symmetric all extremities  Skin:   Skin color, texture, turgor normal, no rashes or lesions  Lymph nodes:   Cervical, supraclavicular, and axillary nodes normal  Neurologic:   CNII-XII intact, normal strength, sensation and reflexes    throughout    LABORATORY DATA:  Results for orders placed or performed in visit on 12/25/21 (from the past 48 hour(s))  CBC with Differential (Cancer Center Only)     Status: Abnormal   Collection Time: 12/25/21  3:10 PM  Result Value Ref Range   WBC Count 8.6 4.0 - 10.5 K/uL   RBC 4.03 3.87 - 5.11 MIL/uL   Hemoglobin 11.0 (L) 12.0 - 15.0 g/dL   HCT 27.5 17.0 - 01.7 %   MCV 89.8 80.0 - 100.0 fL   MCH 27.3 26.0 - 34.0 pg   MCHC 30.4 30.0 - 36.0 g/dL   RDW 49.4 49.6 - 75.9 %   Platelet Count 345 150 - 400 K/uL   nRBC 0.0 0.0 - 0.2 %   Neutrophils Relative % 73 %   Neutro Abs 6.2 1.7 - 7.7 K/uL   Lymphocytes Relative 20 %   Lymphs Abs 1.8 0.7 - 4.0 K/uL   Monocytes Relative 5 %   Monocytes Absolute 0.5 0.1 - 1.0 K/uL   Eosinophils Relative 1 %   Eosinophils Absolute 0.0 0.0 - 0.5 K/uL   Basophils Relative 0 %   Basophils Absolute 0.0 0.0 - 0.1 K/uL   Immature Granulocytes 1 %   Abs Immature Granulocytes 0.11 (H) 0.00 - 0.07 K/uL    Comment: Performed at [facility] at [facility], [address] [place], [place] [address]  Save Smear for Provider Slide Review     Status: None   Collection Time: 12/25/21  3:10 PM  Result Value Ref Range   Smear Review SMEAR STAINED AND AVAILABLE FOR REVIEW     Comment: Performed at [facility] at [facility], [address] [place], [place] [address]  Reticulocytes     Status: Abnormal   Collection Time: 12/25/21  3:11 PM  Result Value Ref Range   Retic Ct Pct 2.2 0.4 - 3.1 %  RBC. 3.96 3.87 - 5.11 MIL/uL    Retic Count, Absolute 88.7 19.0 - 186.0 K/uL   Immature Retic Fract 23.1 (H) 2.3 - 15.9 %    Comment: Performed at [facility] at [facility], [address] [place], [place] [address]      RADIOGRAPHY: No results found.     PATHOLOGY: None  ASSESSMENT/PLAN:   All questions were answered. The patient knows to call the clinic with any problems, questions or concerns. We can certainly see the patient much sooner if necessary.  The patient was discussed with Dr. Myna Hidalgo and he is in agreement with the aforementioned.   Eileen Stanford, NP

## 2021-12-26 ENCOUNTER — Encounter: Payer: Self-pay | Admitting: Family

## 2021-12-26 DIAGNOSIS — D509 Iron deficiency anemia, unspecified: Secondary | ICD-10-CM | POA: Insufficient documentation

## 2021-12-26 LAB — IRON AND IRON BINDING CAPACITY (CC-WL,HP ONLY)
Iron: 28 ug/dL (ref 28–170)
Saturation Ratios: 5 % — ABNORMAL LOW (ref 10.4–31.8)
TIBC: 518 ug/dL — ABNORMAL HIGH (ref 250–450)
UIBC: 490 ug/dL — ABNORMAL HIGH (ref 148–442)

## 2021-12-31 ENCOUNTER — Telehealth: Payer: Self-pay | Admitting: *Deleted

## 2021-12-31 NOTE — Telephone Encounter (Signed)
Per 12/25/21 los - called and gave upcoming appointments - confirmed 

## 2022-01-09 ENCOUNTER — Inpatient Hospital Stay: Payer: Medicare Other | Attending: Family

## 2022-01-09 VITALS — BP 131/76 | HR 58 | Temp 98.4°F | Resp 17

## 2022-01-09 DIAGNOSIS — Z9884 Bariatric surgery status: Secondary | ICD-10-CM | POA: Diagnosis not present

## 2022-01-09 DIAGNOSIS — D509 Iron deficiency anemia, unspecified: Secondary | ICD-10-CM | POA: Insufficient documentation

## 2022-01-09 MED ORDER — SODIUM CHLORIDE 0.9 % IV SOLN
510.0000 mg | Freq: Once | INTRAVENOUS | Status: AC
  Administered 2022-01-09: 510 mg via INTRAVENOUS
  Filled 2022-01-09: qty 17

## 2022-01-09 MED ORDER — SODIUM CHLORIDE 0.9% FLUSH: 10.0000 mL | Freq: Once | INTRAVENOUS | Status: DC | PRN

## 2022-01-09 MED ORDER — SODIUM CHLORIDE 0.9% FLUSH: 3.0000 mL | Freq: Once | INTRAVENOUS | Status: DC | PRN

## 2022-01-09 MED ORDER — SODIUM CHLORIDE 0.9 % IV SOLN: Freq: Once | INTRAVENOUS | Status: AC

## 2022-01-09 NOTE — Patient Instructions (Signed)

## 2022-01-16 ENCOUNTER — Inpatient Hospital Stay: Payer: Medicare Other

## 2022-01-16 VITALS — BP 133/81 | HR 71 | Temp 98.6°F | Resp 17

## 2022-01-16 DIAGNOSIS — D509 Iron deficiency anemia, unspecified: Secondary | ICD-10-CM | POA: Diagnosis not present

## 2022-01-16 MED ORDER — SODIUM CHLORIDE 0.9% FLUSH: 10.0000 mL | Freq: Once | INTRAVENOUS | Status: DC | PRN

## 2022-01-16 MED ORDER — SODIUM CHLORIDE 0.9 % IV SOLN
510.0000 mg | Freq: Once | INTRAVENOUS | Status: AC
  Administered 2022-01-16: 510 mg via INTRAVENOUS
  Filled 2022-01-16: qty 17

## 2022-01-16 MED ORDER — SODIUM CHLORIDE 0.9 % IV SOLN: Freq: Once | INTRAVENOUS | Status: AC

## 2022-01-16 MED ORDER — SODIUM CHLORIDE 0.9% FLUSH: 3.0000 mL | Freq: Once | INTRAVENOUS | Status: DC | PRN

## 2022-01-16 NOTE — Progress Notes (Signed)
Patient refused to wait 30 minutes post infusion. Released stable and ASX. 

## 2022-01-16 NOTE — Patient Instructions (Signed)

## 2022-01-30 ENCOUNTER — Other Ambulatory Visit (HOSPITAL_BASED_OUTPATIENT_CLINIC_OR_DEPARTMENT_OTHER): Payer: Self-pay

## 2022-01-30 ENCOUNTER — Encounter: Payer: Self-pay | Admitting: Family

## 2022-01-30 MED ORDER — HYDROCODONE-ACETAMINOPHEN 10-325 MG PO TABS
1.0000 | ORAL_TABLET | Freq: Four times a day (QID) | ORAL | 0 refills | Status: AC | PRN
  Filled 2022-01-30: qty 120, 30d supply, fill #0

## 2022-02-18 ENCOUNTER — Other Ambulatory Visit (HOSPITAL_BASED_OUTPATIENT_CLINIC_OR_DEPARTMENT_OTHER): Payer: Self-pay

## 2022-02-18 MED ORDER — PREGABALIN 100 MG PO CAPS
100.0000 mg | ORAL_CAPSULE | Freq: Three times a day (TID) | ORAL | 3 refills | Status: DC
  Filled 2022-02-18: qty 90, 30d supply, fill #0

## 2022-02-24 ENCOUNTER — Inpatient Hospital Stay: Payer: Medicare Other | Attending: Family

## 2022-02-24 ENCOUNTER — Inpatient Hospital Stay: Payer: Medicare Other | Admitting: Family

## 2022-02-27 ENCOUNTER — Other Ambulatory Visit (HOSPITAL_BASED_OUTPATIENT_CLINIC_OR_DEPARTMENT_OTHER): Payer: Self-pay

## 2022-03-04 ENCOUNTER — Encounter (HOSPITAL_BASED_OUTPATIENT_CLINIC_OR_DEPARTMENT_OTHER): Payer: Self-pay | Admitting: Emergency Medicine

## 2022-03-04 ENCOUNTER — Emergency Department (HOSPITAL_BASED_OUTPATIENT_CLINIC_OR_DEPARTMENT_OTHER)
Admission: EM | Admit: 2022-03-04 | Discharge: 2022-03-04 | Disposition: A | Discharge status: Home / Self Care | Payer: Medicare Other | Attending: Emergency Medicine | Admitting: Emergency Medicine

## 2022-03-04 ENCOUNTER — Other Ambulatory Visit: Payer: Self-pay

## 2022-03-04 DIAGNOSIS — Z7982 Long term (current) use of aspirin: Secondary | ICD-10-CM | POA: Insufficient documentation

## 2022-03-04 DIAGNOSIS — Z760 Encounter for issue of repeat prescription: Secondary | ICD-10-CM | POA: Insufficient documentation

## 2022-03-04 DIAGNOSIS — M5431 Sciatica, right side: Secondary | ICD-10-CM | POA: Diagnosis not present

## 2022-03-04 MED ORDER — HYDROMORPHONE HCL 1 MG/ML IJ SOLN
2.0000 mg | Freq: Once | INTRAMUSCULAR | Status: AC
  Administered 2022-03-04: 2 mg via INTRAMUSCULAR
  Filled 2022-03-04: qty 2

## 2022-03-04 MED ORDER — DEXAMETHASONE SODIUM PHOSPHATE 10 MG/ML IJ SOLN
8.0000 mg | Freq: Once | INTRAMUSCULAR | Status: AC
  Administered 2022-03-04: 8 mg via INTRAMUSCULAR
  Filled 2022-03-04: qty 1

## 2022-03-04 MED ORDER — METOPROLOL SUCCINATE ER 25 MG PO TB24: 25.0000 mg | ORAL_TABLET | Freq: Every day | ORAL | 0 refills | Status: DC

## 2022-03-04 MED ORDER — AMLODIPINE BESYLATE 5 MG PO TABS: 5.0000 mg | ORAL_TABLET | Freq: Every day | ORAL | 0 refills | Status: DC

## 2022-03-04 NOTE — ED Triage Notes (Addendum)
Right lower back into right buttocks pain x 2 days. States it feels like someone punched her. Pain worsens with sitting. Hx of sciatica. States feels the same.  Pt also requesting refills on amlodipine and metoprolol.

## 2022-03-04 NOTE — ED Provider Notes (Signed)
[facility] EMERGENCY DEPARTMENT Provider Note   CSN: [id number] Arrival date & time: 03/04/22  2135     History  Chief Complaint  Patient presents with   Back Pain   Medication Refill    [name] Steele is a 56 y.o. female presenting to the ED with sciatica of the right side.  This is a chronic ongoing issue for a long time.  She has tried Lyrica, she takes Norco 10 mg tablets, she has tried gabapentin, Tylenol.  Nothing is helping her pain it is more severe, and she does not have an appointment coming up with a new PCP until the middle of November.  She separately reports that she also needs a refill of her amlodipine and metoprolol medications until she can see her PCP  HPI     Home Medications Prior to Admission medications   Medication Sig Start Date End Date Taking? Authorizing Provider  amLODipine (NORVASC) 5 MG tablet Take 1 tablet (5 mg total) by mouth daily. 03/04/22 05/03/22 Yes Rhaelyn Giron, Kermit Balo, MD  metoprolol succinate (TOPROL-XL) 25 MG 24 hr tablet Take 1 tablet (25 mg total) by mouth daily. 03/04/22 05/03/22 Yes Kenley Troop, Kermit Balo, MD  amLODipine (NORVASC) 5 MG tablet Take 1 tablet (5 mg total) by mouth daily. 12/23/21   Redwine, Madison A, PA-C  aspirin EC 325 MG tablet Take 325 mg by mouth daily. 11/22/21   [provider]  cetirizine (ZYRTEC) 10 MG tablet TK 1 T PO QD 09/30/16   [provider]  Cholecalciferol 50000 units TABS Take by mouth.    [provider]  cyanocobalamin (,VITAMIN B-12,) 1000 MCG/ML injection Inject into the muscle. 12/01/16   [provider]  cyclobenzaprine (FLEXERIL) 10 MG tablet TK 1 T PO Q 8 H PRN Patient not taking: Reported on 12/25/2021 03/03/17   [provider]  diclofenac (VOLTAREN) 75 MG EC tablet Take 1 tablet (75 mg total) 2 (two) times daily by mouth. Patient not taking: Reported on 12/25/2021 03/20/17   Lenda Kelp, MD  diclofenac sodium (VOLTAREN) 1 % GEL Apply  topically 4 (four) times daily.    [provider]  esomeprazole (NEXIUM) 40 MG capsule TK 1 C PO EVERY EVE 10/13/16   [provider]  fluticasone (FLONASE) 50 MCG/ACT nasal spray Place 2 sprays into both nostrils daily. 04/07/21   Teressa Lower, PA-C  gabapentin (NEURONTIN) 300 MG capsule  12/24/16   [provider]  hydrochlorothiazide (HYDRODIURIL) 25 MG tablet TK 1 T PO ONCE D 10/02/16   [provider]  HYDROcodone-acetaminophen (NORCO) 10-325 MG tablet TK 1 T PO Q 8 H PRN 03/04/17   [provider]  HYDROcodone-acetaminophen (NORCO) 10-325 MG tablet Take 1 tablet by mouth every 6 (six) hours as needed. 01/30/22     LORazepam (ATIVAN) 0.5 MG tablet Take 0.5 mg by mouth. 12/26/16   [provider]  metoprolol succinate (TOPROL-XL) 25 MG 24 hr tablet Take 1 tablet (25 mg total) by mouth daily. 12/23/21   Redwine, Madison A, PA-C  nitroGLYCERIN (NITRODUR - DOSED IN MG/24 HR) 0.2 mg/hr patch Apply 1/4th patch to affected shoulder, change daily 03/20/17   Hudnall, Azucena Fallen, MD  nystatin cream (MYCOSTATIN) Apply topically. 08/06/16   [provider]  predniSONE (DELTASONE) 10 MG tablet 6 tabs po day 1, 5 tabs po day 2, 4 tabs po day 3, 3 tabs po day 4, 2 tabs po day 5, 1 tab po day 6 Patient  not taking: Reported on 12/25/2021 06/25/18   Lenda Kelp, MD  pregabalin (LYRICA) 100 MG capsule Take 1 capsule (100 mg total) by mouth 3 (three) times daily. 02/18/22     rOPINIRole (REQUIP) 0.25 MG tablet  08/22/16   [provider]  tiZANidine (ZANAFLEX) 4 MG tablet Take 4-8 mg by mouth at bedtime. 12/11/21   [provider]  TRANSDERM-SCOP, 1.5 MG, 1 MG/3DAYS UNW AND APP 1 PA TO SKIN Q 3 DAYS. 12/11/16   [provider]      Allergies    Patient has no known allergies.    Review of Systems   Review of Systems  Physical Exam Updated Vital Signs BP (!) 154/106 (BP Location: Left Arm)   Pulse 68   Temp 98 F (36.7 C)    Resp 20   Ht 5\' 6"  (1.676 m)   Wt 81.2 kg   SpO2 97%   BMI 28.89 kg/m  Physical Exam Constitutional:      General: She is not in acute distress. HENT:     Head: Normocephalic and atraumatic.  Eyes:     Conjunctiva/sclera: Conjunctivae normal.     Pupils: Pupils are equal, round, and reactive to light.  Cardiovascular:     Rate and Rhythm: Normal rate and regular rhythm.  Pulmonary:     Effort: Pulmonary effort is normal. No respiratory distress.  Skin:    General: Skin is warm and dry.  Neurological:     General: No focal deficit present.     Mental Status: She is alert and oriented to person, place, and time. Mental status is at baseline.  Psychiatric:        Mood and Affect: Mood normal.        Behavior: Behavior normal.     ED Results / Procedures / Treatments   Labs (all labs ordered are listed, but only abnormal results are displayed) Labs Reviewed - No data to display  EKG None  Radiology No results found.  Procedures Procedures    Medications Ordered in ED Medications  dexamethasone (DECADRON) injection 8 mg (has no administration in time range)  HYDROmorphone (DILAUDID) injection 2 mg (has no administration in time range)    ED Course/ Medical Decision Making/ A&P                           Medical Decision Making Risk Prescription drug management.   Patient is here with sciatica pain.  Doubt cauda equina syndrome or acute traumatic fracture of the spine.  This is a chronic ongoing issue.  IM pain medication and Decadron given as a single injection in the ED, but explained I would not prescribe any narcotics for this at home, she is already taking 10 mg Norco's.  She verbalized understanding.  I did send a prolonged prescription for amlodipine and metoprolol for the next 2 months until she can see her PCP.        Final Clinical Impression(s) / ED Diagnoses Final diagnoses:  Sciatica of right side  Medication refill    Rx / DC  Orders ED Discharge Orders          Ordered    amLODipine (NORVASC) 5 MG tablet  Daily        03/04/22 2219    metoprolol succinate (TOPROL-XL) 25 MG 24 hr tablet  Daily        03/04/22 2219  Wyvonnia Dusky, MD 03/04/22 2220

## 2022-03-06 ENCOUNTER — Encounter (HOSPITAL_BASED_OUTPATIENT_CLINIC_OR_DEPARTMENT_OTHER): Payer: Self-pay

## 2022-03-06 ENCOUNTER — Emergency Department (HOSPITAL_BASED_OUTPATIENT_CLINIC_OR_DEPARTMENT_OTHER)
Admission: EM | Admit: 2022-03-06 | Discharge: 2022-03-06 | Discharge status: Against Medical Advice | Payer: Medicare Other | Attending: Emergency Medicine | Admitting: Emergency Medicine

## 2022-03-06 ENCOUNTER — Other Ambulatory Visit: Payer: Self-pay

## 2022-03-06 DIAGNOSIS — Z5321 Procedure and treatment not carried out due to patient leaving prior to being seen by health care provider: Secondary | ICD-10-CM | POA: Insufficient documentation

## 2022-03-06 DIAGNOSIS — M545 Low back pain, unspecified: Secondary | ICD-10-CM | POA: Insufficient documentation

## 2022-03-06 DIAGNOSIS — Z79899 Other long term (current) drug therapy: Secondary | ICD-10-CM | POA: Insufficient documentation

## 2022-03-06 DIAGNOSIS — Z7982 Long term (current) use of aspirin: Secondary | ICD-10-CM | POA: Insufficient documentation

## 2022-03-06 DIAGNOSIS — I10 Essential (primary) hypertension: Secondary | ICD-10-CM | POA: Insufficient documentation

## 2022-03-06 DIAGNOSIS — G8929 Other chronic pain: Secondary | ICD-10-CM

## 2022-03-06 MED ORDER — DEXAMETHASONE SODIUM PHOSPHATE 10 MG/ML IJ SOLN: 8.0000 mg | Freq: Once | INTRAMUSCULAR | Status: DC

## 2022-03-06 MED ORDER — HYDROMORPHONE HCL 1 MG/ML IJ SOLN: 1.0000 mg | Freq: Once | INTRAMUSCULAR | Status: DC

## 2022-03-06 NOTE — ED Notes (Signed)
Pt left before treatment, observed by unit secretary, states "I'm leaving, he can't do anything more for me"

## 2022-03-06 NOTE — ED Triage Notes (Addendum)
Pt with hx back pain and sciatica, states seen here on Tuesday, back pain unresolved, ambulatory with pain, steady gait, took oxycodone at [facility]

## 2022-03-06 NOTE — ED Provider Notes (Signed)
[place] EMERGENCY DEPARTMENT Provider Note   CSN: [id number] Arrival date & time: 03/06/22  0856     History  Chief Complaint  Patient presents with   Back Pain   HPI [name] Steele is a 56 y.o. female with hypertension and recent history of sciatic pain presenting for lower back pain.  Started 2 days ago.  Pain is located in the right buttock at times it radiates to the left buttock.  Pain is all day long.  States she feels like she was "punched with the fist".  She has been taking hydrocodone and Flexeril at home for symptoms but states that her symptoms are worse.  Mentioned that she was evaluated for this pain 2 days ago and it was determined that she has sciatic pain on the right side.  Was treated with dexamethasone and Dilaudid.  Denies fever, saddle anesthesia, urinary or bowel incontinence.  Also denies recent back surgery or trauma.   Back Pain      Home Medications Prior to Admission medications   Medication Sig Start Date End Date Taking? Authorizing Provider  amLODipine (NORVASC) 5 MG tablet Take 1 tablet (5 mg total) by mouth daily. 12/23/21   Redwine, Madison A, PA-C  amLODipine (NORVASC) 5 MG tablet Take 1 tablet (5 mg total) by mouth daily. 03/04/22 05/03/22  Wyvonnia Dusky, MD  aspirin EC 325 MG tablet Take 325 mg by mouth daily. 11/22/21   [provider]  cetirizine (ZYRTEC) 10 MG tablet TK 1 T PO QD 09/30/16   [provider]  Cholecalciferol 50000 units TABS Take by mouth.    [provider]  cyanocobalamin (,VITAMIN B-12,) 1000 MCG/ML injection Inject into the muscle. 12/01/16   [provider]  cyclobenzaprine (FLEXERIL) 10 MG tablet TK 1 T PO Q 8 H PRN Patient not taking: Reported on 12/25/2021 03/03/17   [provider]  diclofenac (VOLTAREN) 75 MG EC tablet Take 1 tablet (75 mg total) 2 (two) times daily by mouth. Patient not taking: Reported on 12/25/2021 03/20/17   Dene Gentry, MD   diclofenac sodium (VOLTAREN) 1 % GEL Apply topically 4 (four) times daily.    [provider]  esomeprazole (NEXIUM) 40 MG capsule TK 1 C PO EVERY EVE 10/13/16   [provider]  fluticasone (FLONASE) 50 MCG/ACT nasal spray Place 2 sprays into both nostrils daily. 04/07/21   Hendricks Limes, PA-C  gabapentin (NEURONTIN) 300 MG capsule  12/24/16   [provider]  hydrochlorothiazide (HYDRODIURIL) 25 MG tablet TK 1 T PO ONCE D 10/02/16   [provider]  HYDROcodone-acetaminophen (NORCO) 10-325 MG tablet TK 1 T PO Q 8 H PRN 03/04/17   [provider]  HYDROcodone-acetaminophen (NORCO) 10-325 MG tablet Take 1 tablet by mouth every 6 (six) hours as needed. 01/30/22     LORazepam (ATIVAN) 0.5 MG tablet Take 0.5 mg by mouth. 12/26/16   [provider]  metoprolol succinate (TOPROL-XL) 25 MG 24 hr tablet Take 1 tablet (25 mg total) by mouth daily. 12/23/21   Redwine, Madison A, PA-C  metoprolol succinate (TOPROL-XL) 25 MG 24 hr tablet Take 1 tablet (25 mg total) by mouth daily. 03/04/22 05/03/22  Wyvonnia Dusky, MD  nitroGLYCERIN (NITRODUR - DOSED IN MG/24 HR) 0.2 mg/hr patch Apply 1/4th patch to affected shoulder, change daily 03/20/17   Hudnall, Sharyn Lull, MD  nystatin cream (MYCOSTATIN) Apply topically. 08/06/16   [provider]  predniSONE (DELTASONE) 10 MG tablet  6 tabs po day 1, 5 tabs po day 2, 4 tabs po day 3, 3 tabs po day 4, 2 tabs po day 5, 1 tab po day 6 Patient not taking: Reported on 12/25/2021 06/25/18   Dene Gentry, MD  pregabalin (LYRICA) 100 MG capsule Take 1 capsule (100 mg total) by mouth 3 (three) times daily. 02/18/22     rOPINIRole (REQUIP) 0.25 MG tablet  08/22/16   [provider]  tiZANidine (ZANAFLEX) 4 MG tablet Take 4-8 mg by mouth at bedtime. 12/11/21   [provider]  TRANSDERM-SCOP, 1.5 MG, 1 MG/3DAYS UNW AND APP 1 PA TO SKIN Q 3 DAYS. 12/11/16   [provider]      Allergies     Patient has no known allergies.    Review of Systems   Review of Systems  Musculoskeletal:  Positive for back pain.    Physical Exam Updated Vital Signs BP 133/85   Pulse 65   Temp 98.4 F (36.9 C) (Oral)   Resp 18   Ht 5\' 6"  (1.676 m)   Wt 81.2 kg   SpO2 99%   BMI 28.89 kg/m  Physical Exam Vitals and nursing note reviewed.  HENT:     Head: Normocephalic and atraumatic.     Mouth/Throat:     Mouth: Mucous membranes are moist.  Eyes:     General:        Right eye: No discharge.        Left eye: No discharge.     Conjunctiva/sclera: Conjunctivae normal.  Cardiovascular:     Rate and Rhythm: Normal rate and regular rhythm.     Pulses: Normal pulses.     Heart sounds: Normal heart sounds.  Pulmonary:     Effort: Pulmonary effort is normal.     Breath sounds: Normal breath sounds.  Abdominal:     General: Abdomen is flat.     Palpations: Abdomen is soft.  Musculoskeletal:     Comments: TTP to the right buttock.  Pain with flexion extension of the back.  Skin:    General: Skin is warm and dry.  Neurological:     General: No focal deficit present.  Psychiatric:        Mood and Affect: Mood normal.     ED Results / Procedures / Treatments   Labs (all labs ordered are listed, but only abnormal results are displayed) Labs Reviewed - No data to display  EKG None  Radiology No results found.  Procedures Procedures    Medications Ordered in ED Medications  HYDROmorphone (DILAUDID) injection 1 mg (has no administration in time range)  dexamethasone (DECADRON) injection 8 mg (has no administration in time range)    ED Course/ Medical Decision Making/ A&P                           Medical Decision Making  Patient presented for lower back pain.  Initially was diagnosed with sciatic pain after evaluation in the emergency department.  Pain and symptoms today seem to be consistent with that diagnosis.  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  Patient eloped before receiving treatment.        Final Clinical Impression(s) / ED Diagnoses Final diagnoses:  None    Rx / DC Orders ED Discharge Orders  None         Harriet Pho, PA-C 03/06/22 1041    Tegeler, Gwenyth Allegra, MD 03/06/22 [phone]

## 2022-03-12 ENCOUNTER — Ambulatory Visit (INDEPENDENT_AMBULATORY_CARE_PROVIDER_SITE_OTHER): Payer: Medicare Other | Admitting: Family

## 2022-03-12 ENCOUNTER — Encounter: Payer: Self-pay | Admitting: Family

## 2022-03-12 VITALS — BP 130/74 | HR 73 | Temp 98.7°F | Resp 16 | Ht 64.5 in | Wt 186.0 lb

## 2022-03-12 DIAGNOSIS — G8929 Other chronic pain: Secondary | ICD-10-CM | POA: Insufficient documentation

## 2022-03-12 DIAGNOSIS — H9312 Tinnitus, left ear: Secondary | ICD-10-CM | POA: Insufficient documentation

## 2022-03-12 DIAGNOSIS — E538 Deficiency of other specified B group vitamins: Secondary | ICD-10-CM

## 2022-03-12 DIAGNOSIS — M5431 Sciatica, right side: Secondary | ICD-10-CM

## 2022-03-12 DIAGNOSIS — G4730 Sleep apnea, unspecified: Secondary | ICD-10-CM | POA: Diagnosis not present

## 2022-03-12 DIAGNOSIS — I10 Essential (primary) hypertension: Secondary | ICD-10-CM | POA: Insufficient documentation

## 2022-03-12 DIAGNOSIS — I2510 Atherosclerotic heart disease of native coronary artery without angina pectoris: Secondary | ICD-10-CM | POA: Insufficient documentation

## 2022-03-12 DIAGNOSIS — M5441 Lumbago with sciatica, right side: Secondary | ICD-10-CM

## 2022-03-12 DIAGNOSIS — D509 Iron deficiency anemia, unspecified: Secondary | ICD-10-CM

## 2022-03-12 HISTORY — DX: Sciatica, right side: M54.31

## 2022-03-12 MED ORDER — CYANOCOBALAMIN 1000 MCG/ML IJ SOLN
1000.0000 ug | Freq: Once | INTRAMUSCULAR | Status: AC
  Administered 2022-03-12: 1000 ug via INTRAMUSCULAR

## 2022-03-12 MED ORDER — METHYLPREDNISOLONE 4 MG PO TBPK: ORAL_TABLET | ORAL | 0 refills | Status: DC

## 2022-03-12 MED ORDER — SCOPOLAMINE 1 MG/3DAYS TD PT72: 1.0000 | MEDICATED_PATCH | TRANSDERMAL | 0 refills | Status: DC

## 2022-03-12 MED ORDER — ATORVASTATIN CALCIUM 40 MG PO TABS: 40.0000 mg | ORAL_TABLET | Freq: Every day | ORAL | 1 refills | Status: DC

## 2022-03-12 NOTE — Assessment & Plan Note (Signed)
BP Readings from Last 3 Encounters:  03/12/22 130/74  03/06/22 133/85  03/04/22 (!) 154/106   BP is stable. Continue metoprolol and amlodipine.

## 2022-03-12 NOTE — Assessment & Plan Note (Signed)
New.  Minimal improvement with her hydrocodone and steroid injection given in the ED.  Will give trial of medrol dose pak. Consider PT referral when acute pain symptoms improve.

## 2022-03-12 NOTE — Assessment & Plan Note (Addendum)
Followed by Dr. Estella Husk who is prescribing her hydrocodone q6 hours. Gabapentin has been unhelpful for her in the past.

## 2022-03-12 NOTE — Assessment & Plan Note (Signed)
B12 IM today then continue monthly.

## 2022-03-12 NOTE — Assessment & Plan Note (Signed)
I suspect this is contributing to her daytime somnolence and HTN.  We discussed long term health risks of untreated OSA including CHF, heart attack, stroke. She is agreeable to establish with a sleep specialist to look into CPAP therapy with a more comfortable mask.

## 2022-03-12 NOTE — Assessment & Plan Note (Signed)
Lab Results  Component Value Date   WBC 8.6 12/25/2021   HGB 11.0 (L) 12/25/2021   HCT 36.2 12/25/2021   MCV 89.8 12/25/2021   PLT 345 12/25/2021   This is being managed by hematology.

## 2022-03-12 NOTE — Assessment & Plan Note (Signed)
S/p NSTEMI 7/23. Evaluated at [facility] medical center.  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  She is not currently taking statin- restart recommended statin (atorvastatin) along with full dose aspirin. Continue beta blocker bp control and pursue treatment of OSA as below.

## 2022-03-12 NOTE — Progress Notes (Signed)
Subjective:   By signing my name below, I, [name] Steele, attest that this documentation has been prepared under the direction and in the presence of [name] Downs' [place], NP 03/12/2022   Patient ID: [name] Steele, female    DOB: May 21, 1965, 56 y.o.   MRN: [id number]  Chief Complaint  Patient presents with   New Patient (Initial Visit)    HPI Patient is in today for establishment of patient care   Refill: She is requesting a refill of 40 mg of Atorvastatin.   PCP Establishment: She had a PCP in the past in [place], her originally PCP left and therefore was trying to establish care with a female doctor. She was originally going to establish care with a PCP in [place] due to a potential blood clot and did not get a call back. She states that she has not seen a PCP in about three months ago  Sciatic Nerve Pain: She complains of sciatic nerve pain. She reports that pai radiates up her arm and down her leg. She states that appeared about a week ago. She was admitted to 03/06/2022, for the sciatic pain and received cortisol injections for her symptoms. She was able to sleep well throughout the night but since then, has not been able to. She states her 10-325 mg of Norco is prescribed from Dr.Runheim. She has done PT for her back pain in the past but not for her sciatica. She notes that she has had sciatic pain last year but it went away before reappearing this year.  Tinnitus: She complains of tinnitus in her left ear. She also has associated symptoms of hearing loss. She states that symptoms appeared about 6-7 months ago. She previously had a hearing aid but it picked up too much noise causing her irritation.   Social History: She has three boys, two of them are twins. She has 10 grandchildren. Her family members are local. She is not married but lives with her children's father. She is currently raising her 50 yo grand-nephew. She caters for work and decorates. Her highest level of  education is some college. She enjoys cooking during her free time. She does not have any pets at this moment.   Myocardial Infraction: She was admitted to the ED on 11/14/2021 for a myocardial infraction. She undergone a left heart catheterization on 11/15/2021. She followed up with Dr.Causey on 12/11/2021 and was prescribed 40 mg of Lipitor but as of today's visit, is no longer taking the medication  Blood Pressure: Her blood pressure during today's visit is normal. She is currently taking 5 mg of Amlodipine and 25 mg of Metoprolol Succinate.  She is no longer taking Hctz.  BP Readings from Last 3 Encounters:  03/12/22 130/74  03/06/22 133/85  03/04/22 (!) 154/106   Pulse Readings from Last 3 Encounters:  03/12/22 73  03/06/22 65  03/04/22 68   Iron: She reports that she receives iron injections due to fatigue. She states that she has significant sleepiness during the day.   Back Pain: She reports that her lower back pain is worsening. She was previously receiving injections from her specialist for her symptoms. She was informed that her symptoms are due to arthritis in her symptoms. She is currently taking 4 mg of Tizanidine for her symptoms. She is no longer taking Gabapentin and states that it does not work for her.   Sleep Apnea: She states that she was previously using a Cpap and was not able to respond well  to it due to the cold air entering her nose.   Vitamin B12: She is requesting an injection of Vitamin B12. She has been on b12 injections in the past but not recently.   Depression: She reports of some depression due to her bodily symptoms.  Immunizations: She is not interested in receiving an influenza vaccine during today's visit. She states that she has an intolerance to the injection, and therefore receives it every other year   Health Maintenance Due  Topic Date Due   Medicare Annual Wellness (AWV)  Never done   HIV Screening  Never done   Hepatitis C Screening  Never  done   PAP SMEAR-Modifier  Never done   COLONOSCOPY (Pts 56-60yrs Insurance coverage will need to be confirmed)  Never done   MAMMOGRAM  Never done   Zoster Vaccines- Shingrix (1 of 2) Never done   COVID-19 Vaccine (3 - Pfizer series) 10/11/2019   TETANUS/TDAP  07/11/2021   INFLUENZA VACCINE  12/03/2021    Past Medical History:  Diagnosis Date   Chronic low back pain    Hypertension    Myocardial infarction (HCC) 2023   Sleep apnea     Past Surgical History:  Procedure Laterality Date   ABDOMINAL HYSTERECTOMY     BREAST REDUCTION SURGERY     FOOT SURGERY     GASTRIC BYPASS     TUBAL LIGATION      Family History  Problem Relation Age of Onset   COPD Mother    Throat cancer Mother    Diabetes Father    Kidney disease Father    Breast cancer Sister     Social History   Socioeconomic History   Marital status: Single    Spouse name: Not on file   Number of children: Not on file   Years of education: Not on file   Highest education level: Not on file  Occupational History   Not on file  Tobacco Use   Smoking status: Never   Smokeless tobacco: Never  Substance and Sexual Activity   Alcohol use: No   Drug use: No   Sexual activity: Yes    Birth control/protection: Surgical  Other Topics Concern   Not on file  Social History Narrative   3 sons (one set of twins), grown   10 grandchildren (family is local)   Lives with father or her children   Raising her great nephew (this is a stressor)   Works in Therapist, occupational,  runs a venue for parties   Completed some college   Enjoys cooking   No pets    Social Determinants of Corporate investment banker Strain: Not on Ship broker Insecurity: Not on file  Transportation Needs: Not on file  Physical Activity: Not on file  Stress: Not on file  Social Connections: Not on file  Intimate Partner Violence: Not on file    Outpatient Medications Prior to Visit  Medication Sig Dispense Refill   amLODipine (NORVASC) 5 MG  tablet Take 1 tablet (5 mg total) by mouth daily. 30 tablet 1   amLODipine (NORVASC) 5 MG tablet Take 1 tablet (5 mg total) by mouth daily. 60 tablet 0   aspirin EC 325 MG tablet Take 325 mg by mouth daily.     cetirizine (ZYRTEC) 10 MG tablet TK 1 T PO QD  0   Cholecalciferol 50000 units TABS Take by mouth.     cyanocobalamin (,VITAMIN B-12,) 1000 MCG/ML injection Inject into the muscle.  diclofenac sodium (VOLTAREN) 1 % GEL Apply topically 4 (four) times daily.     ergocalciferol (VITAMIN D2) 1.25 MG (50000 UT) capsule Take 50,000 Units by mouth once a week.     esomeprazole (NEXIUM) 40 MG capsule TK 1 C PO EVERY EVE  0   fluticasone (FLONASE) 50 MCG/ACT nasal spray Place 2 sprays into both nostrils daily. 15.8 mL 0   HYDROcodone-acetaminophen (NORCO) 10-325 MG tablet Take 1 tablet by mouth every 6 (six) hours as needed. 120 tablet 0   metoprolol succinate (TOPROL-XL) 25 MG 24 hr tablet Take 1 tablet (25 mg total) by mouth daily. 30 tablet 1   metoprolol succinate (TOPROL-XL) 25 MG 24 hr tablet Take 1 tablet (25 mg total) by mouth daily. 60 tablet 0   nystatin cream (MYCOSTATIN) Apply topically.     pregabalin (LYRICA) 100 MG capsule Take 1 capsule (100 mg total) by mouth 3 (three) times daily. 90 capsule 3   tiZANidine (ZANAFLEX) 4 MG tablet Take 4-8 mg by mouth at bedtime.     cyclobenzaprine (FLEXERIL) 5 MG tablet TK 1 T PO BID PRF MUSCLE SPASMS     hydrochlorothiazide (HYDRODIURIL) 25 MG tablet TK 1 T PO ONCE D  0   LORazepam (ATIVAN) 0.5 MG tablet Take 0.5 mg by mouth.     nitroGLYCERIN (NITRODUR - DOSED IN MG/24 HR) 0.2 mg/hr patch Apply 1/4th patch to affected shoulder, change daily 30 patch 1   rOPINIRole (REQUIP) 0.25 MG tablet      TRANSDERM-SCOP, 1.5 MG, 1 MG/3DAYS UNW AND APP 1 PA TO SKIN Q 3 DAYS.  1   cyclobenzaprine (FLEXERIL) 10 MG tablet TK 1 T PO Q 8 H PRN (Patient not taking: Reported on 12/25/2021)  2   diclofenac (VOLTAREN) 75 MG EC tablet Take 1 tablet (75 mg total) 2  (two) times daily by mouth. (Patient not taking: Reported on 12/25/2021) 60 tablet 1   gabapentin (NEURONTIN) 300 MG capsule  (Patient not taking: Reported on 12/25/2021)     HYDROcodone-acetaminophen (NORCO) 10-325 MG tablet TK 1 T PO Q 8 H PRN  0   predniSONE (DELTASONE) 10 MG tablet 6 tabs po day 1, 5 tabs po day 2, 4 tabs po day 3, 3 tabs po day 4, 2 tabs po day 5, 1 tab po day 6 (Patient not taking: Reported on 12/25/2021) 21 tablet 0   No facility-administered medications prior to visit.    No Known Allergies  Review of Systems  Constitutional:  Positive for malaise/fatigue.  HENT:  Positive for hearing loss (Left Ear) and tinnitus (Left Ear).   Musculoskeletal:  Positive for back pain.  Neurological:        (+) Pain radiates down right leg and upwards towards her right shoulder  Psychiatric/Behavioral:  Positive for depression.        Objective:    Physical Exam Constitutional:      General: She is not in acute distress.    Appearance: Normal appearance. She is not ill-appearing.  HENT:     Head: Normocephalic and atraumatic.     Right Ear: External ear normal.     Left Ear: External ear normal.  Eyes:     Extraocular Movements: Extraocular movements intact.     Pupils: Pupils are equal, round, and reactive to light.  Cardiovascular:     Rate and Rhythm: Normal rate and regular rhythm.     Heart sounds: Normal heart sounds. No murmur heard.    No gallop.  Pulmonary:     Effort: Pulmonary effort is normal. No respiratory distress.     Breath sounds: Normal breath sounds. No wheezing or rales.  Skin:    General: Skin is warm and dry.  Neurological:     Mental Status: She is alert and oriented to person, place, and time.  Psychiatric:        Mood and Affect: Mood normal.        Behavior: Behavior normal.        Judgment: Judgment normal.     BP 130/74 (BP Location: Right Arm, Patient Position: Sitting, Cuff Size: Small)   Pulse 73   Temp 98.7 F (37.1 C) (Oral)    Resp 16   Ht 5' 4.5" (1.638 m)   Wt 186 lb (84.4 kg)   SpO2 98%   BMI 31.43 kg/m  Wt Readings from Last 3 Encounters:  03/12/22 186 lb (84.4 kg)  03/06/22 179 lb (81.2 kg)  03/04/22 179 lb (81.2 kg)       Assessment & Plan:   Problem List Items Addressed This Visit       Unprioritized   Tinnitus of left ear    Has know hx of hearing loss in that ear and has been fitted for hearing aids in the past.  Discussed that her tinnitus is likely related to her hearing loss and there is really no treatment for tinnitis. Discussed use of white noise to drown out the ringing.        Sleep apnea - Primary    I suspect this is contributing to her daytime somnolence and HTN.  We discussed long term health risks of untreated OSA including CHF, heart attack, stroke. She is agreeable to establish with a sleep specialist to look into CPAP therapy with a more comfortable mask.       Relevant Orders   Ambulatory referral to Pulmonology   Sciatica of right side    New.  Minimal improvement with her hydrocodone and steroid injection given in the ED.  Will give trial of medrol dose pak. Consider PT referral when acute pain symptoms improve.       IDA (iron deficiency anemia)    Lab Results  Component Value Date   WBC 8.6 12/25/2021   HGB 11.0 (L) 12/25/2021   HCT 36.2 12/25/2021   MCV 89.8 12/25/2021   PLT 345 12/25/2021  This is being managed by hematology.       Hypertension    BP Readings from Last 3 Encounters:  03/12/22 130/74  03/06/22 133/85  03/04/22 (!) 154/106  BP is stable. Continue metoprolol and amlodipine.       Relevant Medications   atorvastatin (LIPITOR) 40 MG tablet   Coronary artery disease involving native heart without angina pectoris    S/p NSTEMI 7/23. Evaluated at [facility].  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  She is not currently taking statin- restart recommended statin (atorvastatin) along with  full dose aspirin. Continue beta blocker bp control and pursue treatment of OSA as below.       Relevant Medications   atorvastatin (LIPITOR) 40 MG tablet   Chronic low back pain    Followed by Dr. Estella Husk who is prescribing her hydrocodone q6 hours. Gabapentin has been unhelpful for her in the past.       Relevant Medications   methylPREDNISolone (MEDROL DOSEPAK) 4 MG TBPK tablet   B12 deficiency    B12  IM today then continue monthly.       Meds ordered this encounter  Medications   atorvastatin (LIPITOR) 40 MG tablet    Sig: Take 1 tablet (40 mg total) by mouth daily.    Dispense:  90 tablet    Refill:  1    Order Specific Question:   Supervising Provider    Answer:   Danise Edge A [[id number]]   scopolamine (TRANSDERM-SCOP) 1 MG/3DAYS    Sig: Place 1 patch (1.5 mg total) onto the skin every 3 (three) days.    Dispense:  5 patch    Refill:  0    Order Specific Question:   Supervising Provider    Answer:   Danise Edge A [[id number]]   methylPREDNISolone (MEDROL DOSEPAK) 4 MG TBPK tablet    Sig: Take per package instructions    Dispense:  21 tablet    Refill:  0    Order Specific Question:   Supervising Provider    Answer:   Danise Edge A [[id number]]   cyanocobalamin (VITAMIN B12) injection 1,000 mcg   >60 minutes spent on today's visit. The majority of this time was spent interviewing patient and reviewing outside medical records.   I, Lemont Fillers, NP, personally preformed the services described in this documentation.  All medical record entries made by the scribe were at my direction and in my presence.  I have reviewed the chart and discharge instructions (if applicable) and agree that the record reflects my personal performance and is accurate and complete. 02/09/2022   I,Amber Collins,acting as a scribe for Lemont Fillers, NP.,have documented all relevant documentation on the behalf of Lemont Fillers, NP,as directed by  Lemont Fillers, NP while  in the presence of Lemont Fillers, NP.    Lemont Fillers, NP

## 2022-03-12 NOTE — Assessment & Plan Note (Signed)
Has know hx of hearing loss in that ear and has been fitted for hearing aids in the past.  Discussed that her tinnitus is likely related to her hearing loss and there is really no treatment for tinnitis. Discussed use of white noise to drown out the ringing.

## 2022-03-14 ENCOUNTER — Telehealth: Payer: Self-pay | Admitting: Family

## 2022-03-14 NOTE — Telephone Encounter (Signed)
Pt called stating that the medication that was issues for sciatic nerve pain is not working and was wondering if there was any other course of treatment that could be issued to help treat this. Please advise.

## 2022-03-15 ENCOUNTER — Encounter (HOSPITAL_BASED_OUTPATIENT_CLINIC_OR_DEPARTMENT_OTHER): Payer: Self-pay | Admitting: Emergency Medicine

## 2022-03-15 ENCOUNTER — Emergency Department (HOSPITAL_BASED_OUTPATIENT_CLINIC_OR_DEPARTMENT_OTHER)
Admission: EM | Admit: 2022-03-15 | Discharge: 2022-03-15 | Disposition: A | Discharge status: Home / Self Care | Payer: Medicare Other | Attending: Emergency Medicine | Admitting: Emergency Medicine

## 2022-03-15 ENCOUNTER — Other Ambulatory Visit: Payer: Self-pay

## 2022-03-15 DIAGNOSIS — Z7982 Long term (current) use of aspirin: Secondary | ICD-10-CM | POA: Insufficient documentation

## 2022-03-15 DIAGNOSIS — M5431 Sciatica, right side: Secondary | ICD-10-CM | POA: Insufficient documentation

## 2022-03-15 DIAGNOSIS — I10 Essential (primary) hypertension: Secondary | ICD-10-CM | POA: Diagnosis not present

## 2022-03-15 DIAGNOSIS — M549 Dorsalgia, unspecified: Secondary | ICD-10-CM | POA: Diagnosis present

## 2022-03-15 DIAGNOSIS — Z79899 Other long term (current) drug therapy: Secondary | ICD-10-CM | POA: Diagnosis not present

## 2022-03-15 MED ORDER — PREDNISONE 10 MG PO TABS: ORAL_TABLET | ORAL | 0 refills | Status: AC

## 2022-03-15 MED ORDER — DEXAMETHASONE SODIUM PHOSPHATE 10 MG/ML IJ SOLN
8.0000 mg | Freq: Once | INTRAMUSCULAR | Status: AC
  Administered 2022-03-15: 8 mg via INTRAMUSCULAR
  Filled 2022-03-15: qty 1

## 2022-03-15 MED ORDER — HYDROMORPHONE HCL 1 MG/ML IJ SOLN
0.5000 mg | Freq: Once | INTRAMUSCULAR | Status: AC
  Administered 2022-03-15: 0.5 mg via INTRAMUSCULAR
  Filled 2022-03-15: qty 1

## 2022-03-15 NOTE — Discharge Instructions (Addendum)
Recommend taking steroid Dosepak as prescribed.  Continue taking your chronic pain medicine of Percocet and gabapentin as prescribed.  Recommend calling to set up appoint with neurosurgery with the referral Dr. Edwyna Shell who you have seen before or the neurosurgeon [place] through [facility].  Also recommend following through with physical therapy as your primary care provider recommended.  Please do not hesitate to return to emergency department for worrisome signs and symptoms we discussed become apparent.

## 2022-03-15 NOTE — ED Triage Notes (Signed)
Pt arrives self driven, slow gait, c/o lower RT side back pain radiating to LLE. Also c/o LT shoulder pain, tx recently for same. Endorses meds not relieving pain. Reports taking hydrocodone at 1230. Pt reports concern that no xray performed

## 2022-03-15 NOTE — ED Notes (Signed)
D/c paperwork reviewed with pt, including prescription f/u care. No questions or concerns at time of d/c. Pt wheeled to ED exit by NT, accompanied by family.

## 2022-03-15 NOTE — ED Provider Notes (Signed)
[facility] EMERGENCY DEPARTMENT Provider Note   CSN: [id number] Arrival date & time: 03/15/22  1429     History  Chief Complaint  Patient presents with   Back Pain    [name] Steele is a 56 y.o. female.   Back Pain   56 year old female presents emergency department with "right-sided sciatica."  Patient has a history of similar symptoms in the past.  She has been seen 3 times over the past 12 days for the same symptoms.  She has been prescribed Medrol Dosepak which she states helped her symptoms initially but they have since returned after completing Dosepak yesterday.  She is on Percocet and gabapentin at baseline for chronic right-sided back pain of which she states has not been helping very much recently.  She has not been able to get back in with the pain clinic.  Pain is described as sharp in nature starting in the buttock region with radiation down her posterior right leg down to her foot.  Pain is worsened with movement and is relieved with rest.  Denies any recent falls/traumas, saddle esthesia, bowel/bladder dysfunction, weakness/sensory deficits in lower extremities, fever, history of IV drug use, known malignancy.  She states this feels exactly same as other exacerbations but pain is increased.  Past medical history significant for myocardial infarction, chronic low back pain, OSA, hypertension, obesity  Home Medications Prior to Admission medications   Medication Sig Start Date End Date Taking? Authorizing Provider  predniSONE (DELTASONE) 10 MG tablet Take 4 tablets (40 mg total) by mouth daily for 5 days, THEN 3 tablets (30 mg total) daily for 4 days, THEN 2 tablets (20 mg total) daily for 3 days, THEN 1 tablet (10 mg total) daily for 3 days, THEN 0.5 tablets (5 mg total) daily for 4 days. 03/15/22 04/03/22 Yes Sherian Maroon A, PA  amLODipine (NORVASC) 5 MG tablet Take 1 tablet (5 mg total) by mouth daily. 03/04/22 05/03/22  Terald Sleeper, MD  aspirin EC  325 MG tablet Take 325 mg by mouth daily. 11/22/21   [provider]  atorvastatin (LIPITOR) 40 MG tablet Take 1 tablet (40 mg total) by mouth daily. 03/12/22   Sandford Craze, NP  cetirizine (ZYRTEC) 10 MG tablet TK 1 T PO QD 09/30/16   [provider]  Cholecalciferol 50000 units TABS Take by mouth.    [provider]  cyanocobalamin (,VITAMIN B-12,) 1000 MCG/ML injection Inject into the muscle. 12/01/16   [provider]  diclofenac sodium (VOLTAREN) 1 % GEL Apply topically 4 (four) times daily.    [provider]  ergocalciferol (VITAMIN D2) 1.25 MG (50000 UT) capsule Take 50,000 Units by mouth once a week.    [provider]  esomeprazole (NEXIUM) 40 MG capsule TK 1 C PO EVERY EVE 10/13/16   [provider]  fluticasone (FLONASE) 50 MCG/ACT nasal spray Place 2 sprays into both nostrils daily. 04/07/21   Teressa Lower, PA-C  HYDROcodone-acetaminophen (NORCO) 10-325 MG tablet Take 1 tablet by mouth every 6 (six) hours as needed. 01/30/22     metoprolol succinate (TOPROL-XL) 25 MG 24 hr tablet Take 1 tablet (25 mg total) by mouth daily. 12/23/21   Redwine, Madison A, PA-C  nystatin cream (MYCOSTATIN) Apply topically. 08/06/16   [provider]  pregabalin (LYRICA) 100 MG capsule Take 1 capsule (100 mg total) by mouth 3 (three) times daily. 02/18/22     scopolamine (TRANSDERM-SCOP) 1 MG/3DAYS Place 1 patch (1.5 mg total) onto  the skin every 3 (three) days. 03/12/22   Sandford Craze, NP  tiZANidine (ZANAFLEX) 4 MG tablet Take 4-8 mg by mouth at bedtime. 12/11/21   [provider]      Allergies    Patient has no known allergies.    Review of Systems   Review of Systems  Musculoskeletal:  Positive for back pain.  All other systems reviewed and are negative.   Physical Exam Updated Vital Signs BP (!) 145/93   Pulse 73   Temp 98.4 F (36.9 C)   Resp 18   Ht 5' 4.5" (1.638 m)   Wt 81.6 kg   SpO2 100%    BMI 30.42 kg/m  Physical Exam Vitals and nursing note reviewed.  Constitutional:      General: She is not in acute distress.    Appearance: She is well-developed.  HENT:     Head: Normocephalic and atraumatic.  Eyes:     Conjunctiva/sclera: Conjunctivae normal.  Cardiovascular:     Rate and Rhythm: Normal rate and regular rhythm.     Heart sounds: No murmur heard. Pulmonary:     Effort: Pulmonary effort is normal. No respiratory distress.     Breath sounds: Normal breath sounds.  Abdominal:     Palpations: Abdomen is soft.     Tenderness: There is no abdominal tenderness.  Musculoskeletal:        General: No swelling.     Cervical back: Neck supple.     Comments: No midline tenderness to palpation of cervical, thoracic, lumbar spine with no obvious step-off or deformity noted.  No paraspinal tenderness noted in the lumbar region.  Tenderness to palpation in the right buttock region.  Straight leg raise positive on the right.  Strength 5 out of 5 for lower extremities.  No sensory deficits along the lower extremities of major nerve distributions.  DTR symmetric bilaterally.  Pedal pulses full and intact bilaterally.  No obvious overlying skin abnormalities noted.  Skin:    General: Skin is warm and dry.     Capillary Refill: Capillary refill takes less than 2 seconds.  Neurological:     Mental Status: She is alert.  Psychiatric:        Mood and Affect: Mood normal.     ED Results / Procedures / Treatments   Labs (all labs ordered are listed, but only abnormal results are displayed) Labs Reviewed - No data to display  EKG None  Radiology No results found.  Procedures Procedures    Medications Ordered in ED Medications  HYDROmorphone (DILAUDID) injection 0.5 mg (0.5 mg Intramuscular Given 03/15/22 1603)  dexamethasone (DECADRON) injection 8 mg (8 mg Intramuscular Given 03/15/22 1604)    ED Course/ Medical Decision Making/ A&P                           Medical  Decision Making Risk Prescription drug management.   This patient presents to the ED for concern of back pain, this involves an extensive number of treatment options, and is a complaint that carries with it a high risk of complications and morbidity.  The differential diagnosis includes The emergent differential diagnosis for back pain includes but is not limited to fracture, muscle strain, cauda equina, spinal stenosis. DDD, ankylosing spondylitis, acute ligamentous injury, disk herniation, spondylolisthesis, Epidural compression syndrome, metastatic cancer, transverse myelitis, vertebral osteomyelitis, diskitis, kidney stone, pyelonephritis, AAA, Perforated ulcer, Retrocecal appendicitis, pancreatitis, bowel obstruction, retroperitoneal hemorrhage or mass, meningitis.  Co morbidities that complicate the patient evaluation  See HPI   Additional history obtained:  Additional history obtained from EMR External records from outside source obtained and reviewed including MRI from 1/20   Lab Tests:  N/a   Imaging Studies ordered:  N/a   Cardiac Monitoring: / EKG:  The patient was maintained on a cardiac monitor.  I personally viewed and interpreted the cardiac monitored which showed an underlying rhythm of: Sinus rhythm   Consultations Obtained:  Attending physician Dr. Dalene Seltzer regarding the patient.  She was in agreement with treatment plan going forward.   Problem List / ED Course / Critical interventions / Medication management  Sciatica I ordered medication including dilaudid and decadron   Reevaluation of the patient after these medicines showed that the patient improved I have reviewed the patients home medicines and have made adjustments as needed   Social Determinants of Health:  Chronic opioid use.  Denies tobacco, illicit drug use.   Test / Admission - Considered:  Sciatica Vitals signs significant for mild hypertension with blood pressure 145/93.   Recommend close follow-up with PCP regarding admission blood pressure.. Otherwise within normal range and stable throughout visit. Patient symptoms likely secondary to chronic back pain with sciatica.  Patient noted significant improvement with Solu-Medrol Dosepak provided by her PCP.  Continued prolonged steroid Dosepak recommended outpatient for patient's current symptoms. Red flag signs for back pain negative so further imaging deemed unnecessary at this time given the patient has been intermittently symptomatic for the past 12 days.  Considered MRI of her lumbar spine the patient was experiencing symptoms for 12 days and has noted improvement with steroids in the past so emergent MRI was not pursued at this time.  Recommend follow-up outpatient with neurosurgery given they have seen her in the past for similar symptoms.  No evidence of cauda equina, spinal epidural abscess.  Also recommended following through with physical therapy as primary care provider as discussed.  Patient noted improvement with medicines administered while emergency department.  Treat plan discussed with patient she melitracen was agreeable to said plan. Worrisome signs and symptoms were discussed with the patient, and the patient acknowledged understanding to return to the ED if noticed. Patient was stable upon discharge.          Final Clinical Impression(s) / ED Diagnoses Final diagnoses:  Sciatica of right side    Rx / DC Orders ED Discharge Orders          Ordered    predniSONE (DELTASONE) 10 MG tablet  Daily        03/15/22 1606              Peter Garter, [place] 03/15/22 1627    Alvira Monday, MD 03/17/22 1256

## 2022-03-21 ENCOUNTER — Telehealth: Payer: Self-pay | Admitting: Family

## 2022-03-21 NOTE — Telephone Encounter (Signed)
Pt stated she has questions about continuing pain meds and other things to discuss with pcp's nurse. She did not go into much detail and would like a call today, before her appt on Monday.

## 2022-03-21 NOTE — Telephone Encounter (Signed)
Called patient back bur no answer, lvm

## 2022-03-24 ENCOUNTER — Ambulatory Visit (INDEPENDENT_AMBULATORY_CARE_PROVIDER_SITE_OTHER): Payer: Medicare Other | Admitting: Family

## 2022-03-24 VITALS — BP 134/79 | HR 83 | Temp 98.8°F | Resp 18 | Wt 184.0 lb

## 2022-03-24 DIAGNOSIS — E538 Deficiency of other specified B group vitamins: Secondary | ICD-10-CM

## 2022-03-24 DIAGNOSIS — I10 Essential (primary) hypertension: Secondary | ICD-10-CM | POA: Diagnosis not present

## 2022-03-24 DIAGNOSIS — M5431 Sciatica, right side: Secondary | ICD-10-CM | POA: Diagnosis not present

## 2022-03-24 DIAGNOSIS — G4730 Sleep apnea, unspecified: Secondary | ICD-10-CM

## 2022-03-24 DIAGNOSIS — I2510 Atherosclerotic heart disease of native coronary artery without angina pectoris: Secondary | ICD-10-CM

## 2022-03-24 NOTE — Assessment & Plan Note (Signed)
BP Readings from Last 3 Encounters:  03/24/22 134/79  03/15/22 (!) 145/93  03/12/22 130/74   BP is stable/improved. Continue metoprolol, amlodipine.

## 2022-03-24 NOTE — Assessment & Plan Note (Signed)
She has not yet scheduled her appointment with the sleep specialist- I gave her their number to call.

## 2022-03-24 NOTE — Assessment & Plan Note (Signed)
Continues monthly b12 injections.  

## 2022-03-24 NOTE — Progress Notes (Addendum)
Subjective:   By signing my name below, I, Gilman Buttner, attest that this documentation has been prepared under the direction and in the presence of Sandford Craze, 03/24/2022.   Patient ID: [name] Steele, female    DOB: [birth year], 56 y.o.   MRN: [id number]  Chief Complaint  Patient presents with   Follow-up    HPI Patient is in today for an office visit.  Sciatic Nerve Pain Patient continues to complain of sciatic nerve pain on her right side.  She was in the office 11/08 and was prescribed Medrol dosepak with no relief. She reports that she sees her neurologist, Dr. Estella Husk for her back pain, but has not reached back out to him recently.  Patient reports that she receives steroid injections in her spine every three months. These have not been helpful. She did not restart lyrica per neurosurgery recs because she states it caused her severe nausea in the past.    She is very uncomfortable due to the sciatica.    Health Maintenance Due  Topic Date Due   Medicare Annual Wellness (AWV)  Never done   HIV Screening  Never done   Hepatitis C Screening  Never done   PAP SMEAR-Modifier  Never done   COLONOSCOPY (Pts 45-61yrs Insurance coverage will need to be confirmed)  Never done   MAMMOGRAM  Never done   Zoster Vaccines- Shingrix (1 of 2) Never done   COVID-19 Vaccine (3 - Pfizer series) 10/11/2019   INFLUENZA VACCINE  12/03/2021    Past Medical History:  Diagnosis Date   Chronic low back pain    Hypertension    Myocardial infarction (HCC) 2023   Sleep apnea     Past Surgical History:  Procedure Laterality Date   ABDOMINAL HYSTERECTOMY     BREAST REDUCTION SURGERY     FOOT SURGERY     GASTRIC BYPASS     TUBAL LIGATION      Family History  Problem Relation Age of Onset   COPD Mother    Throat cancer Mother    Diabetes Father    Kidney disease Father    Breast cancer Sister     Social History   Socioeconomic History   Marital status: Single    Spouse  name: Not on file   Number of children: Not on file   Years of education: Not on file   Highest education level: Not on file  Occupational History   Not on file  Tobacco Use   Smoking status: Never   Smokeless tobacco: Never  Substance and Sexual Activity   Alcohol use: No   Drug use: No   Sexual activity: Yes    Birth control/protection: Surgical  Other Topics Concern   Not on file  Social History Narrative   3 sons (one set of twins), grown   10 grandchildren (family is local)   Lives with father or her children   Raising her great nephew (this is a stressor)   Works in Therapist, occupational,  runs a venue for parties   Completed some college   Enjoys cooking   No pets    Social Determinants of Corporate investment banker Strain: Not on file  Food Insecurity: Not on file  Transportation Needs: Not on file  Physical Activity: Not on file  Stress: Not on file  Social Connections: Not on file  Intimate Partner Violence: Not on file    Outpatient Medications Prior to Visit  Medication Sig Dispense Refill  amLODipine (NORVASC) 5 MG tablet Take 1 tablet (5 mg total) by mouth daily. 60 tablet 0   aspirin EC 325 MG tablet Take 325 mg by mouth daily.     atorvastatin (LIPITOR) 40 MG tablet Take 1 tablet (40 mg total) by mouth daily. 90 tablet 1   cetirizine (ZYRTEC) 10 MG tablet TK 1 T PO QD  0   Cholecalciferol 50000 units TABS Take by mouth.     cyanocobalamin (,VITAMIN B-12,) 1000 MCG/ML injection Inject into the muscle.     diclofenac sodium (VOLTAREN) 1 % GEL Apply topically 4 (four) times daily.     ergocalciferol (VITAMIN D2) 1.25 MG (50000 UT) capsule Take 50,000 Units by mouth once a week.     esomeprazole (NEXIUM) 40 MG capsule TK 1 C PO EVERY EVE  0   fluticasone (FLONASE) 50 MCG/ACT nasal spray Place 2 sprays into both nostrils daily. 15.8 mL 0   HYDROcodone-acetaminophen (NORCO) 10-325 MG tablet Take 1 tablet by mouth every 6 (six) hours as needed. 120 tablet 0    metoprolol succinate (TOPROL-XL) 25 MG 24 hr tablet Take 1 tablet (25 mg total) by mouth daily. 30 tablet 1   nystatin cream (MYCOSTATIN) Apply topically.     predniSONE (DELTASONE) 10 MG tablet Take 4 tablets (40 mg total) by mouth daily for 5 days, THEN 3 tablets (30 mg total) daily for 4 days, THEN 2 tablets (20 mg total) daily for 3 days, THEN 1 tablet (10 mg total) daily for 3 days, THEN 0.5 tablets (5 mg total) daily for 4 days. 43 tablet 0   scopolamine (TRANSDERM-SCOP) 1 MG/3DAYS Place 1 patch (1.5 mg total) onto the skin every 3 (three) days. 5 patch 0   tiZANidine (ZANAFLEX) 4 MG tablet Take 4-8 mg by mouth at bedtime.     pregabalin (LYRICA) 100 MG capsule Take 1 capsule (100 mg total) by mouth 3 (three) times daily. 90 capsule 3   No facility-administered medications prior to visit.    Allergies  Allergen Reactions   Lyrica [Pregabalin]     nausea    Review of Systems  Musculoskeletal:  Positive for back pain (sciatic nerve pain).       Objective:    Physical Exam Constitutional:      General: She is not in acute distress.    Appearance: Normal appearance. She is not ill-appearing.  HENT:     Head: Normocephalic and atraumatic.     Right Ear: External ear normal.     Left Ear: External ear normal.  Eyes:     Extraocular Movements: Extraocular movements intact.     Pupils: Pupils are equal, round, and reactive to light.  Cardiovascular:     Rate and Rhythm: Normal rate and regular rhythm.     Heart sounds: Normal heart sounds. No murmur heard.    No gallop.  Pulmonary:     Effort: Pulmonary effort is normal. No respiratory distress.     Breath sounds: Normal breath sounds. No wheezing or rales.  Skin:    General: Skin is warm and dry.  Neurological:     Mental Status: She is alert and oriented to person, place, and time.  Psychiatric:        Mood and Affect: Mood normal.        Behavior: Behavior normal.        Judgment: Judgment normal.     BP 134/79  (BP Location: Left Arm, Patient Position: Sitting, Cuff Size: Small)  Pulse 83   Temp 98.8 F (37.1 C) (Oral)   Resp 18   Wt 184 lb (83.5 kg)   SpO2 97%   BMI 31.10 kg/m  Wt Readings from Last 3 Encounters:  03/24/22 184 lb (83.5 kg)  03/15/22 180 lb (81.6 kg)  03/12/22 186 lb (84.4 kg)       Assessment & Plan:   Problem List Items Addressed This Visit       Unprioritized   Sleep apnea    She has not yet scheduled her appointment with the sleep specialist- I gave her their number to call.       Sciatica of right side - Primary    Completed MR lumbar spine which was ordered by neurosurgery on 11/17- MRI noted the following:   IMPRESSION:  1. At L5-S1 there is a broad-based disc bulge with a broad right  foraminal disc protrusion contacting the right exiting L5 nerve  root. Moderate right foraminal stenosis. No left foraminal stenosis.  Moderate right and mild left facet arthropathy.  2. At L4-5 there is a broad-based disc bulge. Severe bilateral facet  arthropathy. Left lateral recess stenosis.  3. At L3-4 there is a mild broad-based disc bulge. Mild bilateral  facet arthropathy. Mild left foraminal stenosis.  4. No acute osseous injury of the lumbar spine.   Pain is uncontrolled. No significant improvement with steroids, narcotics or previous ESI.  I think she would likely benefit from surgery.  I recommend that she reach out to the neurosurgeon if she does not hear back from her today to discuss next steps.       Hypertension    BP Readings from Last 3 Encounters:  03/24/22 134/79  03/15/22 (!) 145/93  03/12/22 130/74  BP is stable/improved. Continue metoprolol, amlodipine.       Coronary artery disease involving native heart without angina pectoris    Cardiologist is Dr. Young Berry- she is to follow back up with him 06/2022.       B12 deficiency    Continues monthly b12 injections.       No orders of the defined types were placed in this  encounter.  31 minutes spent on today's visit.  Time was spent counseling patient on her sciatica/MRI results and reviewing outside records in care everywhere.  I, Sandford Craze, personally preformed the services described in this documentation.  All medical record entries made by the scribe were at my direction and in my presence.  I have reviewed the chart and discharge instructions (if applicable) and agree that the record reflects my personal performance and is accurate and complete. 03/24/2022.   I,Verona Buck,acting as a Neurosurgeon for [employer], NP.,have documented all relevant documentation on the behalf of Lemont Fillers, NP,as directed by  Lemont Fillers, NP while in the presence of Lemont Fillers, NP.    Lemont Fillers, NP

## 2022-03-24 NOTE — Patient Instructions (Addendum)
Please call [facility] to schedule an appointment for sleep apnea evaluation:  [phone].

## 2022-03-24 NOTE — Assessment & Plan Note (Signed)
Cardiologist is Dr. Young Berry- she is to follow back up with him 06/2022.

## 2022-03-24 NOTE — Assessment & Plan Note (Addendum)
Completed MR lumbar spine which was ordered by neurosurgery on 11/17- MRI noted the following:   IMPRESSION:  1. At L5-S1 there is a broad-based disc bulge with a broad right  foraminal disc protrusion contacting the right exiting L5 nerve  root. Moderate right foraminal stenosis. No left foraminal stenosis.  Moderate right and mild left facet arthropathy.  2. At L4-5 there is a broad-based disc bulge. Severe bilateral facet  arthropathy. Left lateral recess stenosis.  3. At L3-4 there is a mild broad-based disc bulge. Mild bilateral  facet arthropathy. Mild left foraminal stenosis.  4. No acute osseous injury of the lumbar spine.   Pain is uncontrolled. No significant improvement with steroids, narcotics or previous ESI.  I think she would likely benefit from surgery.  I recommend that she reach out to the neurosurgeon if she does not hear back from her today to discuss next steps.

## 2022-04-10 ENCOUNTER — Institutional Professional Consult (permissible substitution): Payer: Medicare Other | Admitting: Primary Care

## 2022-04-11 ENCOUNTER — Institutional Professional Consult (permissible substitution): Payer: Medicare Other | Admitting: Nurse Practitioner

## 2022-04-15 ENCOUNTER — Institutional Professional Consult (permissible substitution): Payer: Medicare Other | Admitting: Nurse Practitioner

## 2022-04-23 ENCOUNTER — Other Ambulatory Visit: Payer: Self-pay

## 2022-04-23 ENCOUNTER — Emergency Department (HOSPITAL_BASED_OUTPATIENT_CLINIC_OR_DEPARTMENT_OTHER)
Admission: EM | Admit: 2022-04-23 | Discharge: 2022-04-23 | Disposition: A | Discharge status: Home / Self Care | Payer: Medicare Other | Attending: Emergency Medicine | Admitting: Emergency Medicine

## 2022-04-23 ENCOUNTER — Telehealth: Payer: Self-pay | Admitting: Family

## 2022-04-23 DIAGNOSIS — M79671 Pain in right foot: Secondary | ICD-10-CM | POA: Diagnosis present

## 2022-04-23 DIAGNOSIS — Z7982 Long term (current) use of aspirin: Secondary | ICD-10-CM | POA: Insufficient documentation

## 2022-04-23 NOTE — Telephone Encounter (Signed)
Nurse Assessment Nurse: Izora Ribas, RN, Melanie Date/Time [name] Steele Time): 04/23/2022 1:24:36 PM Confirm and document reason for call. If symptomatic, describe symptoms. ---Caller states feet are numbing and tingling for 3 weeks. Blood thinners for mild MI 4 months ago. Sock ring new. Sciatic nerve pain. Borderline DM. Has an appt tomorrow at 1:30pm. Does the patient have any new or worsening symptoms? ---Yes Will a triage be completed? ---Yes Related visit to physician within the last 2 weeks? ---Yes Does the PT have any chronic conditions? (i.e. diabetes, asthma, this includes High risk factors for pregnancy, etc.) ---Yes Is this a behavioral health or substance abuse call? ---No Guidelines Guideline Title Affirmed Question Affirmed Notes Nurse Date/Time [name] Steele Time) Leg Swelling and Edema [1] Thigh or calf pain AND [2] only 1 side AND [3] present > 1 hour Izora Ribas, RN, Shawna Orleans 04/23/2022 1:28:16 PM Disp. Time [name] Steele Time) Disposition Final User 04/23/2022 1:30:16 PM See HCP within 4 Hours (or PCP triage) Yes Izora Ribas, RN, Melanie PLEASE NOTE: All timestamps contained within this report are represented as [place] Standard Time. CONFIDENTIALTY NOTICE: This fax transmission is intended only for the addressee. It contains information that is legally privileged, confidential or otherwise protected from use or disclosure. If you are not the intended recipient, you are strictly prohibited from reviewing, disclosing, copying using or disseminating any of this information or taking any action in reliance on or regarding this information. If you have received this fax in error, please notify us immediately by telephone so that we can arrange for its return to [place]. Phone: [phone], Toll-Free: [phone], Fax: [phone] Page: 2 of 2 Call Id: [id number] Final Disposition 04/23/2022 1:30:16 PM See HCP within 4 Hours (or PCP triage) Yes Izora Ribas, RN, Mittie Bodo Disagree/Comply  Comply Caller Understands Yes PreDisposition Call Doctor Care Advice Given Per Guideline SEE HCP (OR PCP TRIAGE) WITHIN 4 HOURS: * IF OFFICE WILL BE OPEN: You need to be seen within the next 3 or 4 hours. Call your doctor (or NP/PA) now or as soon as the office opens. * OFFICE: If patient sounds stable and not seriously ill, consult PCP (or follow your office policy) to see if patient can be seen NOW in office. CALL EMS IF: * Chest pain or shortness of breath occurs. CARE ADVICE given per Leg Swelling and Edema (Adult) guideline. Comments User: Patria Mane, RN Date/Time [name] Steele Time): 04/23/2022 1:32:40 PM Called backline, no appts available for today. Referrals [facility] - ED

## 2022-04-23 NOTE — Telephone Encounter (Signed)
Pt went to ED

## 2022-04-23 NOTE — ED Provider Notes (Signed)
[facility] [place] EMERGENCY DEPARTMENT Provider Note   CSN: [id number] Arrival date & time: 04/23/22  1453     History  Chief Complaint  Patient presents with   Foot Pain    [name] Steele is a 56 y.o. female.  Patient with history of disc disease in her spine, recent steroid injection about 2 weeks ago with much improvement in her chronic right lower extremity symptoms.  She presents to the emergency department for pain in her foot and toes on the right side.  She denies any weakness in her foot or ankle and has not been having any difficulty walking.  She is on chronic hydrocodone, but states that she ran out earlier this month because of taking additional medication for her sciatica pain.  She states that she called her doctor and has an appointment tomorrow, but was encouraged to come to the emergency department.  She states that the injection that she got in her back greatly improved her leg pain and now she notices the pain in her foot more.  She has mild swelling but no chest pain or shortness of breath.  MRI 11/23 IMPRESSION:  1. At L5-S1 there is a broad-based disc bulge with a broad right  foraminal disc protrusion contacting the right exiting L5 nerve  root. Moderate right foraminal stenosis. No left foraminal stenosis.  Moderate right and mild left facet arthropathy.  2. At L4-5 there is a broad-based disc bulge. Severe bilateral facet  arthropathy. Left lateral recess stenosis.  3. At L3-4 there is a mild broad-based disc bulge. Mild bilateral  facet arthropathy. Mild left foraminal stenosis.  4. No acute osseous injury of the lumbar spine.         Home Medications Prior to Admission medications   Medication Sig Start Date End Date Taking? Authorizing Provider  amLODipine (NORVASC) 5 MG tablet Take 1 tablet (5 mg total) by mouth daily. 03/04/22 05/03/22  Terald Sleeper, MD  aspirin EC 325 MG tablet Take 325 mg by mouth daily. 11/22/21   [provider]  atorvastatin (LIPITOR) 40 MG tablet Take 1 tablet (40 mg total) by mouth daily. 03/12/22   Sandford Craze, NP  cetirizine (ZYRTEC) 10 MG tablet TK 1 T PO QD 09/30/16   [provider]  Cholecalciferol 50000 units TABS Take by mouth.    [provider]  cyanocobalamin (,VITAMIN B-12,) 1000 MCG/ML injection Inject into the muscle. 12/01/16   [provider]  diclofenac sodium (VOLTAREN) 1 % GEL Apply topically 4 (four) times daily.    [provider]  ergocalciferol (VITAMIN D2) 1.25 MG (50000 UT) capsule Take 50,000 Units by mouth once a week.    [provider]  esomeprazole (NEXIUM) 40 MG capsule TK 1 C PO EVERY EVE 10/13/16   [provider]  fluticasone (FLONASE) 50 MCG/ACT nasal spray Place 2 sprays into both nostrils daily. 04/07/21   Teressa Lower, PA-C  HYDROcodone-acetaminophen (NORCO) 10-325 MG tablet Take 1 tablet by mouth every 6 (six) hours as needed. 01/30/22     metoprolol succinate (TOPROL-XL) 25 MG 24 hr tablet Take 1 tablet (25 mg total) by mouth daily. 12/23/21   Redwine, Madison A, PA-C  nystatin cream (MYCOSTATIN) Apply topically. 08/06/16   [provider]  scopolamine (TRANSDERM-SCOP) 1 MG/3DAYS Place 1 patch (1.5 mg total) onto the skin every 3 (three) days. 03/12/22   Sandford Craze, NP  tiZANidine (ZANAFLEX) 4 MG tablet Take 4-8 mg by mouth at bedtime.  12/11/21   [provider]      Allergies    Lyrica [pregabalin]    Review of Systems   Review of Systems  Physical Exam Updated Vital Signs BP 128/81 (BP Location: Left Arm)   Pulse 85   Temp 97.7 F (36.5 C)   Resp 20   Ht 5\' 6"  (1.676 m)   Wt 81.6 kg   SpO2 100%   BMI 29.05 kg/m  Physical Exam Vitals and nursing note reviewed.  Constitutional:      Appearance: She is well-developed.  HENT:     Head: Normocephalic and atraumatic.  Eyes:     Pupils: Pupils are equal, round, and reactive to light.  Cardiovascular:      Pulses: Normal pulses. No decreased pulses.  Musculoskeletal:        General: Swelling present. No tenderness.     Cervical back: Normal range of motion and neck supple.     Comments: Patient has readily palpable DP pulse in her lower right foot.  She reports pain in her toes on the right, but has normal capillary refill less than 2 seconds.  She has minimal edema and a mark from her socks.  No calf tenderness or swelling.  No clinical signs and symptoms of DVT.  Normal strength in ankle and foot.  Skin:    General: Skin is warm and dry.  Neurological:     Mental Status: She is alert.     Sensory: No sensory deficit.     Comments: Motor, sensation, and vascular distal to the injury is fully intact.   Psychiatric:        Mood and Affect: Mood normal.     ED Results / Procedures / Treatments   Labs (all labs ordered are listed, but only abnormal results are displayed) Labs Reviewed - No data to display  EKG None  Radiology No results found.  Procedures Procedures    Medications Ordered in ED Medications - No data to display  ED Course/ Medical Decision Making/ A&P    Patient seen and examined. History obtained directly from patient.  Reviewed previous PCP note.  Work-up including labs, imaging, EKG ordered in triage, if performed, were reviewed.    Labs/EKG: None ordered  Imaging: None ordered  Medications/Fluids: None ordered  Most recent vital signs reviewed and are as follows: BP 128/81 (BP Location: Left Arm)   Pulse 85   Temp 97.7 F (36.5 C)   Resp 20   Ht 5\' 6"  (1.676 m)   Wt 81.6 kg   SpO2 100%   BMI 29.05 kg/m   Initial impression: Foot pain  Home treatment plan: Continued home treatments  Return instructions discussed with patient: Worsening pain, color change, significant swelling  Follow-up instructions discussed with patient: Follow-up with PCP tomorrow as planned                          Medical Decision Making  Patient with pain in  her right foot and toes.  She does not appear to have an acutely threatened limb.  She has trace swelling only.  She has good pulses.  Skin is warm and pink.  No clinical signs of DVT.  She likely has an element of neuropathy, potentially from her back.  Patient is comfortable following up with her primary care as outpatient.  Do not feel that blood work or imaging today is warranted and she is in agreement with this.  The patient's vital signs, pertinent lab work and imaging were reviewed and interpreted as discussed in the ED course. Hospitalization was considered for further testing, treatments, or serial exams/observation. However as patient is well-appearing, has a stable exam, and reassuring studies today, I do not feel that they warrant admission at this time. This plan was discussed with the patient who verbalizes agreement and comfort with this plan and seems reliable and able to return to the Emergency Department with worsening or changing symptoms.          Final Clinical Impression(s) / ED Diagnoses Final diagnoses:  Right foot pain    Rx / DC Orders ED Discharge Orders     None         Renne Crigler, PA-C 04/23/22 1608    Elayne Snare K, DO 04/23/22 1626

## 2022-04-23 NOTE — ED Triage Notes (Signed)
Right foot tingling and swelling x 2 weeks pt called PCP and was told to go to ED. Denies any injury started to hurt after having sciatica on right side.

## 2022-04-23 NOTE — Discharge Instructions (Signed)
Follow-up with your doctor as planned.  You do not have any signs of a skin infection, blockage in the arteries or veins of your leg.  I do not see any reasons for further workup today.  Please continue your home medications as prescribed.

## 2022-04-23 NOTE — ED Notes (Signed)
Discharge instructions reviewed with patient. Patient verbalizes understanding, no further questions at this time. Medications and follow up information provided. No acute distress noted at time of departure.  

## 2022-04-23 NOTE — Telephone Encounter (Signed)
Pt states she has been having ongoing tingling and numbness in her feet for the past couple weeks. This has recently started to become painful. Scheduled her to see Pershing Proud and transferred to triage for nurse eval.

## 2022-04-24 ENCOUNTER — Ambulatory Visit: Payer: Medicare Other | Admitting: Family Medicine

## 2022-04-29 ENCOUNTER — Telehealth: Payer: Self-pay | Admitting: Family

## 2022-04-29 MED ORDER — METOPROLOL SUCCINATE ER 25 MG PO TB24: 25.0000 mg | ORAL_TABLET | Freq: Every day | ORAL | 0 refills | Status: DC

## 2022-04-29 MED ORDER — AMLODIPINE BESYLATE 5 MG PO TABS: 5.0000 mg | ORAL_TABLET | Freq: Every day | ORAL | 0 refills | Status: DC

## 2022-04-29 NOTE — Telephone Encounter (Signed)
Prescription Request  04/29/2022  Is this a "Controlled Substance" medicine? No  LOV: 03/24/2022  What is the name of the medication or equipment?   amLODipine (NORVASC) 5 MG tablet [[id number]]   Heart Pill (pt is unsure of medication name and does not have old bottle to provide info)  Have you contacted your pharmacy to request a refill? No   Which pharmacy would you like this sent to?  [id number] - [place], [place] - [address] AT [facility] OF [address] [place] [address] Phone: [phone] Fax: [phone]    Patient notified that their request is being sent to the clinical staff for review and that they should receive a response within 2 business days.   Please advise at Mobile [phone] (mobile)

## 2022-04-29 NOTE — Telephone Encounter (Signed)
Pt called back with the missing name of the medications:  metoprolol succinate (TOPROL-XL) 25 MG 24 hr tablet [[id number]]   Please Advise

## 2022-05-13 ENCOUNTER — Ambulatory Visit: Payer: Medicare Other | Admitting: Family

## 2022-05-14 ENCOUNTER — Ambulatory Visit (INDEPENDENT_AMBULATORY_CARE_PROVIDER_SITE_OTHER): Payer: Medicare Other | Admitting: Family

## 2022-05-14 VITALS — BP 134/89 | HR 78 | Temp 98.4°F | Resp 16 | Wt 191.0 lb

## 2022-05-14 DIAGNOSIS — L089 Local infection of the skin and subcutaneous tissue, unspecified: Secondary | ICD-10-CM | POA: Insufficient documentation

## 2022-05-14 DIAGNOSIS — K625 Hemorrhage of anus and rectum: Secondary | ICD-10-CM

## 2022-05-14 DIAGNOSIS — M5431 Sciatica, right side: Secondary | ICD-10-CM

## 2022-05-14 DIAGNOSIS — E538 Deficiency of other specified B group vitamins: Secondary | ICD-10-CM

## 2022-05-14 DIAGNOSIS — G4730 Sleep apnea, unspecified: Secondary | ICD-10-CM

## 2022-05-14 LAB — CBC WITH DIFFERENTIAL/PLATELET
Basophils Absolute: 0 10*3/uL (ref 0.0–0.1)
Basophils Relative: 0.7 % (ref 0.0–3.0)
Eosinophils Absolute: 0.1 10*3/uL (ref 0.0–0.7)
Eosinophils Relative: 2 % (ref 0.0–5.0)
HCT: 36 % (ref 36.0–46.0)
Hemoglobin: 12 g/dL (ref 12.0–15.0)
Lymphocytes Relative: 30.2 % (ref 12.0–46.0)
Lymphs Abs: 1.9 10*3/uL (ref 0.7–4.0)
MCHC: 33.4 g/dL (ref 30.0–36.0)
MCV: 96.7 fl (ref 78.0–100.0)
Monocytes Absolute: 0.5 10*3/uL (ref 0.1–1.0)
Monocytes Relative: 7.6 % (ref 3.0–12.0)
Neutro Abs: 3.8 10*3/uL (ref 1.4–7.7)
Neutrophils Relative %: 59.5 % (ref 43.0–77.0)
Platelets: 336 10*3/uL (ref 150.0–400.0)
RBC: 3.72 Mil/uL — ABNORMAL LOW (ref 3.87–5.11)
RDW: 13.8 % (ref 11.5–15.5)
WBC: 6.3 10*3/uL (ref 4.0–10.5)

## 2022-05-14 MED ORDER — CYANOCOBALAMIN 1000 MCG/ML IJ SOLN
1000.0000 ug | Freq: Once | INTRAMUSCULAR | Status: AC
  Administered 2022-05-14: 1000 ug via INTRAMUSCULAR

## 2022-05-14 MED ORDER — CEPHALEXIN 500 MG PO CAPS: 500.0000 mg | ORAL_CAPSULE | Freq: Three times a day (TID) | ORAL | 0 refills | Status: DC

## 2022-05-14 NOTE — Assessment & Plan Note (Signed)
Resolved following ESI with neurosurgery

## 2022-05-14 NOTE — Assessment & Plan Note (Signed)
Discussed importance of getting in with her sleep specialist to get back on CPAP.

## 2022-05-14 NOTE — Assessment & Plan Note (Signed)
Will rx with keflex.  She is advised to soak foot in epsom salt bath twice daily until resolved. If symptoms fail to improve, plan referral to podiatry.

## 2022-05-14 NOTE — Assessment & Plan Note (Signed)
Reports that she has been maintained on weekly injections for years but has slacked off.  Will given today, she will continue monthly at home and plan to repeat b12 in about 1 month.

## 2022-05-14 NOTE — Progress Notes (Signed)
Subjective:   By signing my name below, I, Shehryar Baig, attest that this documentation has been prepared under the direction and in the presence of Debbrah Alar, NP. 05/14/2022   Patient ID: [name] Steele, female    DOB: [birth year], 57 y.o.   MRN: [id number]  Chief Complaint  Patient presents with   Hypertension    Here for follow up   Nail Problem    Pus coming from under nail on right big toe     HPI Patient is in today for follow up visit.   Sciatic nerve pain: Her pain improved following her latest injection. Her neurosurgeon gave her the injection. She reports the back of her right leg hurts every morning.   Foot swelling: Both her feet are swollen for the past 2 weeks. She reports pus coming out from beneath the right great toe with odor. She has been soaking her feet in an epsom salt bath. She also complains of tenderness on bottom right foot.   B12: She continues receiving B12 injections regularly.   Sleep apnea: She has not seen her sleep specialist yet for a sleep apnea evaluation.   Reports that in December she had one episode of painless rectal bleeding.    Past Medical History:  Diagnosis Date   Chronic low back pain    Hypertension    Myocardial infarction ([place]) 2023   Sleep apnea     Past Surgical History:  Procedure Laterality Date   ABDOMINAL HYSTERECTOMY     BREAST REDUCTION SURGERY     FOOT SURGERY     GASTRIC BYPASS     TUBAL LIGATION      Family History  Problem Relation Age of Onset   COPD Mother    Throat cancer Mother    Diabetes Father    Kidney disease Father    Breast cancer Sister     Social History   Socioeconomic History   Marital status: Single    Spouse name: Not on file   Number of children: Not on file   Years of education: Not on file   Highest education level: Not on file  Occupational History   Not on file  Tobacco Use   Smoking status: Never   Smokeless tobacco: Never  Substance and Sexual Activity    Alcohol use: No   Drug use: No   Sexual activity: Yes    Birth control/protection: Surgical  Other Topics Concern   Not on file  Social History Narrative   3 sons (one set of twins), grown   10 grandchildren (family is local)   Lives with father or her children   Raising her great nephew (this is a stressor)   Works in Primary school teacher,  runs a venue for parties   Completed some college   Enjoys cooking   No pets    Social Determinants of Radio broadcast assistant Strain: Not on Art therapist Insecurity: Not on file  Transportation Needs: Not on file  Physical Activity: Not on file  Stress: Not on file  Social Connections: Not on file  Intimate Partner Violence: Not on file    Outpatient Medications Prior to Visit  Medication Sig Dispense Refill   amLODipine (NORVASC) 5 MG tablet Take 1 tablet (5 mg total) by mouth daily. 90 tablet 0   aspirin EC 325 MG tablet Take 325 mg by mouth daily.     atorvastatin (LIPITOR) 40 MG tablet Take 1 tablet (40 mg total) by  mouth daily. 90 tablet 1   cetirizine (ZYRTEC) 10 MG tablet TK 1 T PO QD  0   Cholecalciferol 50000 units TABS Take by mouth.     cyanocobalamin (,VITAMIN B-12,) 1000 MCG/ML injection Inject into the muscle.     diclofenac sodium (VOLTAREN) 1 % GEL Apply topically 4 (four) times daily.     ergocalciferol (VITAMIN D2) 1.25 MG (50000 UT) capsule Take 50,000 Units by mouth once a week.     esomeprazole (NEXIUM) 40 MG capsule TK 1 C PO EVERY EVE  0   fluticasone (FLONASE) 50 MCG/ACT nasal spray Place 2 sprays into both nostrils daily. 15.8 mL 0   HYDROcodone-acetaminophen (NORCO) 10-325 MG tablet Take 1 tablet by mouth every 6 (six) hours as needed. 120 tablet 0   metoprolol succinate (TOPROL-XL) 25 MG 24 hr tablet Take 1 tablet (25 mg total) by mouth daily. 90 tablet 0   nystatin cream (MYCOSTATIN) Apply topically.     scopolamine (TRANSDERM-SCOP) 1 MG/3DAYS Place 1 patch (1.5 mg total) onto the skin every 3 (three) days. 5 patch  0   tiZANidine (ZANAFLEX) 4 MG tablet Take 4-8 mg by mouth at bedtime.     No facility-administered medications prior to visit.    Allergies  Allergen Reactions   Lyrica [Pregabalin]     nausea    Review of Systems  Cardiovascular:        (+)bilateral feet swelling  Musculoskeletal:        (+)tenderness bottom right foot (+)tenderness posterior right lower leg in the morning  Skin:        (+)raised right great toe nail from nail bed       Objective:    Physical Exam Constitutional:      General: She is not in acute distress.    Appearance: Normal appearance. She is not ill-appearing.  HENT:     Head: Normocephalic and atraumatic.     Right Ear: External ear normal.     Left Ear: External ear normal.  Eyes:     Extraocular Movements: Extraocular movements intact.     Pupils: Pupils are equal, round, and reactive to light.  Cardiovascular:     Rate and Rhythm: Normal rate and regular rhythm.     Heart sounds: Normal heart sounds. No murmur heard.    No gallop.  Pulmonary:     Effort: Pulmonary effort is normal. No respiratory distress.     Breath sounds: Normal breath sounds. No wheezing or rales.  Skin:    General: Skin is warm and dry.     Comments: Right great toenail is raised from toe bed (loose), some yellow dried debris noted beneath toenail   Neurological:     Mental Status: She is alert and oriented to person, place, and time.  Psychiatric:        Judgment: Judgment normal.     BP 134/89 (BP Location: Left Arm, Patient Position: Sitting, Cuff Size: Small)   Pulse 78   Temp 98.4 F (36.9 C) (Oral)   Resp 16   Wt 191 lb (86.6 kg)   SpO2 98%   BMI 30.83 kg/m  Wt Readings from Last 3 Encounters:  05/14/22 191 lb (86.6 kg)  04/23/22 180 lb (81.6 kg)  03/24/22 184 lb (83.5 kg)       Assessment & Plan:  Sciatica of right side Assessment & Plan: Resolved following ESI with neurosurgery    Rectal bleeding -     Ambulatory referral to  Gastroenterology -     CBC with Differential/Platelet  B12 deficiency Assessment & Plan: Reports that she has been maintained on weekly injections for years but has slacked off.  Will given today, she will continue monthly at home and plan to repeat b12 in about 1 month.   Orders: -     Vitamin B12; Future -     Cyanocobalamin  Sleep apnea, unspecified type Assessment & Plan: Discussed importance of getting in with her sleep specialist to get back on CPAP.    Toe infection Assessment & Plan: Will rx with keflex.  She is advised to soak foot in epsom salt bath twice daily until resolved. If symptoms fail to improve, plan referral to podiatry.    Other orders -     Cephalexin; Take 1 capsule (500 mg total) by mouth 3 (three) times daily.  Dispense: 21 capsule; Refill: 0    I, Lemont Fillers, NP, personally preformed the services described in this documentation.  All medical record entries made by the scribe were at my direction and in my presence.  I have reviewed the chart and discharge instructions (if applicable) and agree that the record reflects my personal performance and is accurate and complete. 05/14/2022   I,Shehryar Baig,acting as a Neurosurgeon for Lemont Fillers, NP.,have documented all relevant documentation on the behalf of Lemont Fillers, NP,as directed by  Lemont Fillers, NP while in the presence of Lemont Fillers, NP.   Lemont Fillers, NP

## 2022-06-17 ENCOUNTER — Encounter: Payer: Self-pay | Admitting: Family

## 2022-06-18 ENCOUNTER — Ambulatory Visit (INDEPENDENT_AMBULATORY_CARE_PROVIDER_SITE_OTHER): Payer: 59 | Admitting: Family

## 2022-06-18 ENCOUNTER — Encounter: Payer: Self-pay | Admitting: Family

## 2022-06-18 VITALS — BP 116/70 | HR 88 | Temp 98.9°F | Resp 16 | Wt 187.0 lb

## 2022-06-18 DIAGNOSIS — E538 Deficiency of other specified B group vitamins: Secondary | ICD-10-CM

## 2022-06-18 LAB — B12 AND FOLATE PANEL
Folate: 6.7 ng/mL (ref >5.9)
Vitamin B-12: 189 pg/mL — ABNORMAL LOW (ref 211–911)

## 2022-06-18 MED ORDER — CYANOCOBALAMIN 1000 MCG/ML IJ SOLN
1000.0000 ug | Freq: Once | INTRAMUSCULAR | Status: AC
  Administered 2022-06-18: 1000 ug via INTRAMUSCULAR

## 2022-06-18 NOTE — Addendum Note (Signed)
Addended by: Jiles Prows on: 06/18/2022 10:13 AM   Modules accepted: Orders

## 2022-06-18 NOTE — Assessment & Plan Note (Signed)
Suspect that b12 deficiency is cause for her paresthesias.  Will check follow up b12 and folate today, then plan b12 injection weekly x 4 (first dose today) then monthly.

## 2022-06-18 NOTE — Progress Notes (Signed)
Subjective:   By signing my name below, I, Shehryar Baig, attest that this documentation has been prepared under the direction and in the presence of Debbrah Alar, NP. 06/18/2022   Patient ID: [name] Steele, female    DOB: 08-11-1965, 57 y.o.   MRN: [id number]  Chief Complaint  Patient presents with   Numbness    Complains of numbness and tingling of hands and feet    HPI Patient is in today for a office visit.   Tingling in hands and feet: She complains of tingling in her hands and feet for the past 3 weeks. She continues having constant back pain. She has recently developed neck pain and is applying OTC cream regularly to manage it. Her last B12 levels were normal. She last received a B12 injections last month.   Rash: She complains of acne breakouts on her face recently. She has also developed breakouts on her upper chest. They have improved since she found them. She is not using any new products.    Past Medical History:  Diagnosis Date   Chronic low back pain    Hypertension    Myocardial infarction ([place]) 2023   Sleep apnea     Past Surgical History:  Procedure Laterality Date   ABDOMINAL HYSTERECTOMY     BREAST REDUCTION SURGERY     FOOT SURGERY     GASTRIC BYPASS     TUBAL LIGATION      Family History  Problem Relation Age of Onset   COPD Mother    Throat cancer Mother    Diabetes Father    Kidney disease Father    Breast cancer Sister     Social History   Socioeconomic History   Marital status: Single    Spouse name: Not on file   Number of children: Not on file   Years of education: Not on file   Highest education level: Not on file  Occupational History   Not on file  Tobacco Use   Smoking status: Never   Smokeless tobacco: Never  Substance and Sexual Activity   Alcohol use: No   Drug use: No   Sexual activity: Yes    Birth control/protection: Surgical  Other Topics Concern   Not on file  Social History Narrative   3 sons (one  set of twins), grown   10 grandchildren (family is local)   Lives with father or her children   Raising her great nephew (this is a stressor)   Works in Primary school teacher,  runs a venue for parties   Completed some college   Enjoys cooking   No pets    Social Determinants of Radio broadcast assistant Strain: Not on Art therapist Insecurity: Not on file  Transportation Needs: Not on file  Physical Activity: Not on file  Stress: Not on file  Social Connections: Not on file  Intimate Partner Violence: Not on file    Outpatient Medications Prior to Visit  Medication Sig Dispense Refill   amLODipine (NORVASC) 5 MG tablet Take 1 tablet (5 mg total) by mouth daily. 90 tablet 0   aspirin EC 325 MG tablet Take 325 mg by mouth daily.     cetirizine (ZYRTEC) 10 MG tablet TK 1 T PO QD  0   Cholecalciferol 50000 units TABS Take by mouth.     cyanocobalamin (,VITAMIN B-12,) 1000 MCG/ML injection Inject into the muscle.     diclofenac sodium (VOLTAREN) 1 % GEL Apply topically 4 (four)  times daily.     ergocalciferol (VITAMIN D2) 1.25 MG (50000 UT) capsule Take 50,000 Units by mouth once a week.     esomeprazole (NEXIUM) 40 MG capsule TK 1 C PO EVERY EVE  0   HYDROcodone-acetaminophen (NORCO) 10-325 MG tablet Take 1 tablet by mouth every 6 (six) hours as needed. 120 tablet 0   metoprolol succinate (TOPROL-XL) 25 MG 24 hr tablet Take 1 tablet (25 mg total) by mouth daily. 90 tablet 0   scopolamine (TRANSDERM-SCOP) 1 MG/3DAYS Place 1 patch (1.5 mg total) onto the skin every 3 (three) days. 5 patch 0   tiZANidine (ZANAFLEX) 4 MG tablet Take 4-8 mg by mouth at bedtime.     atorvastatin (LIPITOR) 40 MG tablet Take 1 tablet (40 mg total) by mouth daily. (Patient not taking: Reported on 06/18/2022) 90 tablet 1   cephALEXin (KEFLEX) 500 MG capsule Take 1 capsule (500 mg total) by mouth 3 (three) times daily. 21 capsule 0   fluticasone (FLONASE) 50 MCG/ACT nasal spray Place 2 sprays into both nostrils daily. 15.8 mL  0   nystatin cream (MYCOSTATIN) Apply topically.     No facility-administered medications prior to visit.    Allergies  Allergen Reactions   Lyrica [Pregabalin]     nausea    Review of Systems  Musculoskeletal:  Positive for neck pain.  Skin:        (+)acne breakout on face and upper chest  Neurological:  Positive for tingling (hands and feet).       Objective:    Physical Exam Constitutional:      General: She is not in acute distress.    Appearance: Normal appearance.  HENT:     Head: Normocephalic and atraumatic.     Right Ear: External ear normal.     Left Ear: External ear normal.  Eyes:     Extraocular Movements: Extraocular movements intact.     Pupils: Pupils are equal, round, and reactive to light.  Cardiovascular:     Rate and Rhythm: Normal rate and regular rhythm.     Pulses: Normal pulses.     Heart sounds: Normal heart sounds. No murmur heard.    No gallop.  Pulmonary:     Effort: Pulmonary effort is normal. No respiratory distress.     Breath sounds: Normal breath sounds. No wheezing or rales.  Skin:    Comments: Skin appears dry but I do not see any rash on chest or face today  Neurological:     Mental Status: She is alert.     BP 116/70 (BP Location: Right Arm, Patient Position: Sitting, Cuff Size: Large)   Pulse 88   Temp 98.9 F (37.2 C) (Oral)   Resp 16   Wt 187 lb (84.8 kg)   SpO2 99%   BMI 30.18 kg/m  Wt Readings from Last 3 Encounters:  06/18/22 187 lb (84.8 kg)  05/14/22 191 lb (86.6 kg)  04/23/22 180 lb (81.6 kg)       Assessment & Plan:  B12 deficiency Assessment & Plan: Suspect that b12 deficiency is cause for her paresthesias.  Will check follow up b12 and folate today, then plan b12 injection weekly x 4 (first dose today) then monthly.   Orders: -     B12 and Folate Panel    I, Nance Pear, NP, personally preformed the services described in this documentation.  All medical record entries made by the scribe  were at my direction and in my  presence.  I have reviewed the chart and discharge instructions (if applicable) and agree that the record reflects my personal performance and is accurate and complete. 06/18/2022   I,Shehryar Baig,acting as a scribe for Nance Pear, NP.,have documented all relevant documentation on the behalf of Nance Pear, NP,as directed by  Nance Pear, NP while in the presence of Nance Pear, NP.   Nance Pear, NP

## 2022-07-24 ENCOUNTER — Other Ambulatory Visit: Payer: Self-pay | Admitting: Family

## 2022-08-15 ENCOUNTER — Ambulatory Visit (INDEPENDENT_AMBULATORY_CARE_PROVIDER_SITE_OTHER): Payer: 59 | Admitting: Family Medicine

## 2022-08-15 ENCOUNTER — Encounter: Payer: Self-pay | Admitting: Family Medicine

## 2022-08-15 VITALS — BP 129/74 | HR 76 | Ht 66.0 in | Wt 192.0 lb

## 2022-08-15 DIAGNOSIS — E538 Deficiency of other specified B group vitamins: Secondary | ICD-10-CM | POA: Diagnosis not present

## 2022-08-15 DIAGNOSIS — R600 Localized edema: Secondary | ICD-10-CM | POA: Diagnosis not present

## 2022-08-15 DIAGNOSIS — E559 Vitamin D deficiency, unspecified: Secondary | ICD-10-CM | POA: Diagnosis not present

## 2022-08-15 LAB — CBC WITH DIFFERENTIAL/PLATELET
Basophils Absolute: 0 10*3/uL (ref 0.0–0.1)
Basophils Relative: 0.9 % (ref 0.0–3.0)
Eosinophils Absolute: 0.1 10*3/uL (ref 0.0–0.7)
Eosinophils Relative: 2.5 % (ref 0.0–5.0)
HCT: 38.6 % (ref 36.0–46.0)
Hemoglobin: 13 g/dL (ref 12.0–15.0)
Lymphocytes Relative: 39 % (ref 12.0–46.0)
Lymphs Abs: 2.1 10*3/uL (ref 0.7–4.0)
MCHC: 33.7 g/dL (ref 30.0–36.0)
MCV: 93.9 fl (ref 78.0–100.0)
Monocytes Absolute: 0.4 10*3/uL (ref 0.1–1.0)
Monocytes Relative: 6.9 % (ref 3.0–12.0)
Neutro Abs: 2.7 10*3/uL (ref 1.4–7.7)
Neutrophils Relative %: 50.7 % (ref 43.0–77.0)
Platelets: 297 10*3/uL (ref 150.0–400.0)
RBC: 4.11 Mil/uL (ref 3.87–5.11)
RDW: 12.3 % (ref 11.5–15.5)
WBC: 5.3 10*3/uL (ref 4.0–10.5)

## 2022-08-15 LAB — BASIC METABOLIC PANEL
BUN: 13 mg/dL (ref 6–23)
CO2: 30 mEq/L (ref 19–32)
Calcium: 9.1 mg/dL (ref 8.4–10.5)
Chloride: 106 mEq/L (ref 96–112)
Creatinine, Ser: 0.78 mg/dL (ref 0.40–1.20)
GFR: 84.55 mL/min (ref >60.00)
Glucose, Bld: 64 mg/dL — ABNORMAL LOW (ref 70–99)
Potassium: 3.7 mEq/L (ref 3.5–5.1)
Sodium: 143 mEq/L (ref 135–145)

## 2022-08-15 LAB — BRAIN NATRIURETIC PEPTIDE: Pro B Natriuretic peptide (BNP): 17 pg/mL (ref 0.0–100.0)

## 2022-08-15 LAB — VITAMIN D 25 HYDROXY (VIT D DEFICIENCY, FRACTURES): VITD: 35.08 ng/mL (ref 30.00–100.00)

## 2022-08-15 MED ORDER — CYANOCOBALAMIN 1000 MCG/ML IJ SOLN
1000.0000 ug | Freq: Once | INTRAMUSCULAR | Status: AC
  Administered 2022-08-15: 1000 ug via INTRAMUSCULAR

## 2022-08-15 NOTE — Assessment & Plan Note (Addendum)
B12 weekly injection today (first one was 2 months ago, reports she did not know she was supposed to schedule more) Nurse visit in 1 week for next weekly injection

## 2022-08-15 NOTE — Patient Instructions (Signed)
Blood work today to evaluate the swelling. Thankfully it is mostly resolved at this time. If labs are stable, then recommend low sodium diet, regular exercise, compression socks, elevate legs when resting. If swelling returns, we can consider adding a fluid pill, but no obvious swelling at this time. Vitals are stable today.   Rechecking your Vitamin D levels today and refilling your supplement.   Weekly B12 injection given today - schedule nurse visit for next injection.

## 2022-08-15 NOTE — Progress Notes (Signed)
   Acute Office Visit  Subjective:     Patient ID: [name] Steele, female    DOB: 05/30/1965, 57 y.o.   MRN: [id number]  Chief Complaint  Patient presents with   Edema    HPI Patient is in today for edema.   Patient reports that 2-3 weeks ago she started noticing her feet/ankles were swollen while at the beach. Reports this has happened in the past, but she usually doesn't pay much attention to it. States that at one point she was on HCTZ, so she found some leftover pills and took for two days - swelling is mostly gone now. Patient denies any skin change, foot pain, chest pain, palpitations, dyspnea, wheezing, recurrent headaches, vision changes. She eats a general diet.     ROS All review of systems negative except what is listed in the HPI      Objective:    BP 129/74   Pulse 76   Ht 5\' 6"  (1.676 m)   Wt 192 lb (87.1 kg)   SpO2 97%   BMI 30.99 kg/m    Physical Exam Vitals reviewed.  Constitutional:      Appearance: Normal appearance.  Cardiovascular:     Rate and Rhythm: Normal rate and regular rhythm.     Pulses: Normal pulses.     Heart sounds: Normal heart sounds.  Pulmonary:     Effort: Pulmonary effort is normal.     Breath sounds: Normal breath sounds.  Musculoskeletal:     Comments: Minimal non-pitting edema to BLE, no skin changes  Skin:    General: Skin is warm and dry.  Neurological:     Mental Status: She is alert and oriented to person, place, and time.  Psychiatric:        Mood and Affect: Mood normal.        Behavior: Behavior normal.        Thought Content: Thought content normal.        Judgment: Judgment normal.     No results found for any visits on 08/15/22.      Assessment & Plan:   Problem List Items Addressed This Visit     B12 deficiency    B12 weekly injection today (first one was 2 months ago, reports she did not know she was supposed to schedule more) Nurse visit in 1 week for next weekly injection       Vitamin D  deficiency    Update labs today       Relevant Orders   VITAMIN D 25 Hydroxy (Vit-D Deficiency, Fractures)   Other Visit Diagnoses     Lower extremity edema    -  Primary Blood work today to evaluate the swelling. Thankfully it is mostly resolved at this time. If labs are stable, then recommend low sodium diet, regular exercise, compression socks, elevate legs when resting. If swelling returns, we can consider adding a fluid pill, but no obvious swelling at this time. Vitals are stable today.    Relevant Orders   Basic metabolic panel   CBC with Differential/Platelet   B Nat Peptide      Patient aware of signs/symptoms requiring further/urgent evaluation.     Meds ordered this encounter  Medications   cyanocobalamin (VITAMIN B12) injection 1,000 mcg    Return if symptoms worsen or fail to improve, for ; nurse visit B12 in 1 week.  Clayborne Dana, NP

## 2022-08-15 NOTE — Assessment & Plan Note (Signed)
Update labs today 

## 2022-08-22 ENCOUNTER — Ambulatory Visit: Payer: 59

## 2022-09-03 ENCOUNTER — Telehealth: Payer: Self-pay | Admitting: Family

## 2022-09-03 NOTE — Telephone Encounter (Signed)
Contacted [name] Steele to schedule their annual wellness visit. Appointment made for 09/17/2022.  Verlee Rossetti; Care Guide Ambulatory Clinical Support [facility] Direct Dial: [phone]

## 2022-09-17 ENCOUNTER — Ambulatory Visit: Payer: 59

## 2022-09-22 ENCOUNTER — Encounter: Payer: Self-pay | Admitting: Family

## 2022-10-28 ENCOUNTER — Other Ambulatory Visit: Payer: Self-pay | Admitting: Family

## 2022-11-03 ENCOUNTER — Telehealth: Payer: Self-pay | Admitting: Family

## 2022-11-03 NOTE — Telephone Encounter (Signed)
  Patient called to request patches for behind her ear for nausea because she is going on a cruise Thursday. Pt was asking for medication refills but did not know the names or what they were used for so advised her to check with the pharmacy because provider did just send in Metoprolol and it was for 30 days so she should be okay on that one. Pt will call pharmacy to see what is there and have them resend what she needs beyond the patches. Please send to [facility]

## 2022-11-03 NOTE — Telephone Encounter (Signed)
Please advise 

## 2022-11-04 MED ORDER — SCOPOLAMINE 1 MG/3DAYS TD PT72: 1.0000 | MEDICATED_PATCH | TRANSDERMAL | 0 refills | Status: DC

## 2022-11-04 NOTE — Addendum Note (Signed)
Addended by: Sandford Craze on: 11/04/2022 11:52 AM   Modules accepted: Orders

## 2022-11-06 ENCOUNTER — Other Ambulatory Visit: Payer: Self-pay | Admitting: Family

## 2022-11-26 ENCOUNTER — Other Ambulatory Visit: Payer: Self-pay | Admitting: Family

## 2022-12-02 ENCOUNTER — Telehealth: Payer: Self-pay

## 2022-12-02 ENCOUNTER — Encounter: Payer: Self-pay | Admitting: Family

## 2022-12-02 ENCOUNTER — Ambulatory Visit (INDEPENDENT_AMBULATORY_CARE_PROVIDER_SITE_OTHER): Payer: 59 | Admitting: Family

## 2022-12-02 ENCOUNTER — Other Ambulatory Visit (HOSPITAL_BASED_OUTPATIENT_CLINIC_OR_DEPARTMENT_OTHER): Payer: Self-pay

## 2022-12-02 ENCOUNTER — Telehealth (HOSPITAL_BASED_OUTPATIENT_CLINIC_OR_DEPARTMENT_OTHER): Payer: Self-pay

## 2022-12-02 VITALS — BP 123/75 | HR 91 | Temp 98.7°F | Resp 16 | Wt 191.0 lb

## 2022-12-02 DIAGNOSIS — G4733 Obstructive sleep apnea (adult) (pediatric): Secondary | ICD-10-CM | POA: Diagnosis not present

## 2022-12-02 DIAGNOSIS — E538 Deficiency of other specified B group vitamins: Secondary | ICD-10-CM

## 2022-12-02 DIAGNOSIS — E559 Vitamin D deficiency, unspecified: Secondary | ICD-10-CM

## 2022-12-02 DIAGNOSIS — R232 Flushing: Secondary | ICD-10-CM | POA: Diagnosis not present

## 2022-12-02 DIAGNOSIS — G4730 Sleep apnea, unspecified: Secondary | ICD-10-CM

## 2022-12-02 DIAGNOSIS — E785 Hyperlipidemia, unspecified: Secondary | ICD-10-CM | POA: Diagnosis not present

## 2022-12-02 DIAGNOSIS — Z1231 Encounter for screening mammogram for malignant neoplasm of breast: Secondary | ICD-10-CM

## 2022-12-02 DIAGNOSIS — E669 Obesity, unspecified: Secondary | ICD-10-CM | POA: Insufficient documentation

## 2022-12-02 DIAGNOSIS — I10 Essential (primary) hypertension: Secondary | ICD-10-CM | POA: Diagnosis not present

## 2022-12-02 DIAGNOSIS — E663 Overweight: Secondary | ICD-10-CM | POA: Insufficient documentation

## 2022-12-02 LAB — BASIC METABOLIC PANEL
BUN: 12 mg/dL (ref 6–23)
CO2: 26 mEq/L (ref 19–32)
Calcium: 9.1 mg/dL (ref 8.4–10.5)
Chloride: 105 mEq/L (ref 96–112)
Creatinine, Ser: 0.69 mg/dL (ref 0.40–1.20)
GFR: 96.41 mL/min (ref >60.00)
Glucose, Bld: 124 mg/dL — ABNORMAL HIGH (ref 70–99)
Potassium: 3.6 mEq/L (ref 3.5–5.1)
Sodium: 140 mEq/L (ref 135–145)

## 2022-12-02 LAB — TSH: TSH: 1.37 u[IU]/mL (ref 0.35–5.50)

## 2022-12-02 MED ORDER — CYANOCOBALAMIN 1000 MCG/ML IJ SOLN
1000.0000 ug | Freq: Once | INTRAMUSCULAR | Status: AC
  Administered 2022-12-02: 1000 ug via INTRAMUSCULAR

## 2022-12-02 MED ORDER — ATORVASTATIN CALCIUM 40 MG PO TABS: 40.0000 mg | ORAL_TABLET | Freq: Every day | ORAL | 1 refills | Status: DC

## 2022-12-02 MED ORDER — AMLODIPINE BESYLATE 5 MG PO TABS: 5.0000 mg | ORAL_TABLET | Freq: Every day | ORAL | 1 refills | Status: DC

## 2022-12-02 MED ORDER — WEGOVY 0.25 MG/0.5ML ~~LOC~~ SOAJ
0.2500 mg | SUBCUTANEOUS | 1 refills | Status: DC
  Filled 2022-12-02: qty 2, 28d supply, fill #0

## 2022-12-02 MED ORDER — CITALOPRAM HYDROBROMIDE 20 MG PO TABS: 20.0000 mg | ORAL_TABLET | Freq: Every day | ORAL | 3 refills | Status: DC

## 2022-12-02 MED ORDER — METOPROLOL SUCCINATE ER 25 MG PO TB24: 25.0000 mg | ORAL_TABLET | Freq: Every day | ORAL | 1 refills | Status: DC

## 2022-12-02 NOTE — Assessment & Plan Note (Signed)
Trial of Wegovy if insurance will cover.

## 2022-12-02 NOTE — Assessment & Plan Note (Signed)
B12 shot today  Continue monthly

## 2022-12-02 NOTE — Progress Notes (Signed)
Subjective:     Patient ID: [name] Steele, female    DOB: 1965/06/29, 57 y.o.   MRN: [id number]  Chief Complaint  Patient presents with   Hypertension    Here for follow up   Hot Flashes    Patient reports getting hot flashes, "only on her head, gets really hot and sweaty     HPI  Discussed the use of AI scribe software for clinical note transcription with the patient, who gave verbal consent to proceed.  History of Present Illness   The patient, with a history of hysterectomy and heart attack, presents for a routine follow-up. She reports experiencing hot flashes, specifically with head sweating which she describes as a sensation of heat. The patient is unsure if these symptoms are related to menopause, as she underwent a hysterectomy and does not know when she became menopausal.  The patient also reports chronic fatigue and sleep issues. She has been diagnosed with sleep apnea in the past but has been non-compliant with the prescribed CPAP machine due to discomfort and noise. The patient expresses willingness to   She reports a lapse in taking her amlodipine due to a prescription refill issue but has since resumed the medication. The patient also mentions taking a weekly vitamin D supplement 40981. She was taking Wegovy previously which she has been paying for out-of-pocket. She saw good weight loss and tolerated well. States that her insurance has approved brand Wegovy.       Wt Readings from Last 3 Encounters:  12/02/22 191 lb (86.6 kg)  08/15/22 192 lb (87.1 kg)  06/18/22 187 lb (84.8 kg)      Health Maintenance Due  Topic Date Due   Medicare Annual Wellness (AWV)  Never done   Zoster Vaccines- Shingrix (1 of 2) Never done   Colonoscopy  Never done   MAMMOGRAM  Never done   COVID-19 Vaccine (3 - Pfizer risk series) 09/13/2019   DTaP/Tdap/Td (2 - Td or Tdap) 07/11/2021    Past Medical History:  Diagnosis Date   Chronic low back pain    Hypertension     Myocardial infarction (HCC) 2023   Sleep apnea     Past Surgical History:  Procedure Laterality Date   ABDOMINAL HYSTERECTOMY     BREAST REDUCTION SURGERY     FOOT SURGERY     GASTRIC BYPASS     TUBAL LIGATION      Family History  Problem Relation Age of Onset   COPD Mother    Throat cancer Mother    Diabetes Father    Kidney disease Father    Breast cancer Sister     Social History   Socioeconomic History   Marital status: Single    Spouse name: Not on file   Number of children: Not on file   Years of education: Not on file   Highest education level: Not on file  Occupational History   Not on file  Tobacco Use   Smoking status: Never   Smokeless tobacco: Never  Substance and Sexual Activity   Alcohol use: No   Drug use: No   Sexual activity: Yes    Birth control/protection: Surgical  Other Topics Concern   Not on file  Social History Narrative   3 sons (one set of twins), grown   10 grandchildren (family is local)   Lives with father or her children   Raising her great nephew (this is a stressor)   Works in Therapist, occupational,  runs a venue for parties   Completed some college   Enjoys cooking   No pets    Social Determinants of Health   Financial Resource Strain: Medium Risk (06/04/2020)   Received from [facility], [facility]   Overall Financial Resource Strain (CARDIA)    Difficulty of Paying Living Expenses: Somewhat hard  Food Insecurity: No Food Insecurity (09/04/2021)   Received from [facility], [facility]   Hunger Vital Sign    Worried About Running Out of Food in the Last Year: Never true    Ran Out of Food in the Last Year: Never true  Transportation Needs: No Transportation Needs (06/04/2020)   Received from [facility], [facility]   PRAPARE - Transportation    Lack of Transportation (Medical): No    Lack of Transportation (Non-Medical): No  Physical Activity: Unknown (06/04/2020)   Received from [facility], [facility]    Exercise Vital Sign    Days of Exercise per Week: 0 days    Minutes of Exercise per Session: Not on file  Stress: No Stress Concern Present (06/04/2020)   Received from [facility], [facility] - Occupational Stress Questionnaire    Feeling of Stress : Not at all  Social Connections: Unknown (09/01/2021)   Received from [facility], [facility]   Social Network    Social Network: Not on file  Intimate Partner Violence: Unknown (08/06/2021)   Received from [facility], [facility]   HITS    Physically Hurt: Not on file    Insult or Talk Down To: Not on file    Threaten Physical Harm: Not on file    Scream or Curse: Not on file    Outpatient Medications Prior to Visit  Medication Sig Dispense Refill   aspirin EC 325 MG tablet Take 325 mg by mouth daily.     cetirizine (ZYRTEC) 10 MG tablet TK 1 T PO QD  0   Cholecalciferol 50000 units TABS Take by mouth. (Patient not taking: Reported on 08/15/2022)     cyanocobalamin (,VITAMIN B-12,) 1000 MCG/ML injection Inject into the muscle.     diclofenac sodium (VOLTAREN) 1 % GEL Apply topically 4 (four) times daily.     ergocalciferol (VITAMIN D2) 1.25 MG (50000 UT) capsule Take 50,000 Units by mouth once a week.     HYDROcodone-acetaminophen (NORCO) 10-325 MG tablet Take 1 tablet by mouth every 6 (six) hours as needed. 120 tablet 0   scopolamine (TRANSDERM-SCOP) 1 MG/3DAYS Place 1 patch (1.5 mg total) onto the skin every 3 (three) days. 4 patch 0   tiZANidine (ZANAFLEX) 4 MG tablet Take 4-8 mg by mouth at bedtime.     amLODipine (NORVASC) 5 MG tablet Take 1 tablet (5 mg total) by mouth daily. 30 tablet 0   metoprolol succinate (TOPROL-XL) 25 MG 24 hr tablet Take 1 tablet (25 mg total) by mouth daily. 30 tablet 0   No facility-administered medications prior to visit.    Allergies  Allergen Reactions   Lyrica [Pregabalin]     nausea    ROS    See HPI Objective:    Physical  Exam Constitutional:      General: She is not in acute distress.    Appearance: Normal appearance. She is well-developed.  HENT:     Head: Normocephalic and atraumatic.     Right Ear: External ear normal.     Left Ear: External ear normal.  Eyes:  General: No scleral icterus. Neck:     Thyroid: No thyromegaly.  Cardiovascular:     Rate and Rhythm: Normal rate and regular rhythm.     Heart sounds: Normal heart sounds. No murmur heard. Pulmonary:     Effort: Pulmonary effort is normal. No respiratory distress.     Breath sounds: Normal breath sounds. No wheezing.  Musculoskeletal:     Cervical back: Neck supple.  Skin:    General: Skin is warm and dry.  Neurological:     Mental Status: She is alert and oriented to person, place, and time.  Psychiatric:        Mood and Affect: Mood normal.        Behavior: Behavior normal.        Thought Content: Thought content normal.        Judgment: Judgment normal.      BP 123/75 (BP Location: Right Arm, Patient Position: Sitting, Cuff Size: Small)   Pulse 91   Temp 98.7 F (37.1 C) (Oral)   Resp 16   Wt 191 lb (86.6 kg)   SpO2 99%   BMI 30.83 kg/m  Wt Readings from Last 3 Encounters:  12/02/22 191 lb (86.6 kg)  08/15/22 192 lb (87.1 kg)  06/18/22 187 lb (84.8 kg)       Assessment & Plan:   Problem List Items Addressed This Visit       Unprioritized   Vitamin D deficiency    Stable on 50000 international weekly which she states she has been on long term.       Sleep apnea     Patient reports chronic fatigue and has a prior diagnosis of sleep apnea but is non-compliant with CPAP therapy due to discomfort and noise. -Refer to sleep specialist for evaluation and discussion of alternative treatment options, including nasal CPAP or oral appliance.       Obesity (BMI 30.0-34.9)    Trial of Wegovy if insurance will cover.       Relevant Medications   Semaglutide-Weight Management (WEGOVY) 0.25 MG/0.5ML SOAJ    Hypertension    BP Readings from Last 3 Encounters:  12/02/22 123/75  08/15/22 129/74  06/18/22 116/70   BP stable. Continue amlodipine and metoprolol.       Relevant Medications   metoprolol succinate (TOPROL-XL) 25 MG 24 hr tablet   amLODipine (NORVASC) 5 MG tablet   atorvastatin (LIPITOR) 40 MG tablet   Other Relevant Orders   Basic Metabolic Panel (BMET)   Hyperlipidemia    Resume atorvastatin.  Discussed benefits of statin for secondary CV risk reduction.       Relevant Medications   metoprolol succinate (TOPROL-XL) 25 MG 24 hr tablet   amLODipine (NORVASC) 5 MG tablet   atorvastatin (LIPITOR) 40 MG tablet   Hot flashes - Primary    : Patient reports isolated head sweating, suggestive of hot flashes. Unclear menopausal status due to prior hysterectomy. -Start Citalopram 10mg  for one week, then increase to 20mg  daily. -Check thyroid function to rule out hyperthyroidism as a cause of sweating.      Relevant Medications   metoprolol succinate (TOPROL-XL) 25 MG 24 hr tablet   amLODipine (NORVASC) 5 MG tablet   atorvastatin (LIPITOR) 40 MG tablet   Other Relevant Orders   TSH   B12 deficiency    B12 shot today. Continue monthly.      Other Visit Diagnoses     OSA (obstructive sleep apnea)  Relevant Orders   Ambulatory referral to Pulmonology   Breast cancer screening by mammogram       Relevant Orders   MM 3D SCREENING MAMMOGRAM BILATERAL BREAST      General Health Maintenance: -Schedule mammogram for after September 13th. -Check kidney function. -Follow-up in six weeks to assess response to Citalopram and Atorvastatin, and to discuss results of lab tests. -Pt is advised to call  GI to schedule her consult.  Referral was made back in January and it looks like she didn't return their calls to schedule.  I am having [name] Steele start on citalopram, atorvastatin, and Wegovy. I am also having her maintain her diclofenac sodium, cetirizine,  cyanocobalamin, Cholecalciferol, tiZANidine, aspirin EC, HYDROcodone-acetaminophen, ergocalciferol, scopolamine, metoprolol succinate, and amLODipine.  Meds ordered this encounter  Medications   citalopram (CELEXA) 20 MG tablet    Sig: Take 1 tablet (20 mg total) by mouth daily.    Dispense:  30 tablet    Refill:  3    Order Specific Question:   Supervising Provider    Answer:   Danise Edge A [[id number]]   metoprolol succinate (TOPROL-XL) 25 MG 24 hr tablet    Sig: Take 1 tablet (25 mg total) by mouth daily.    Dispense:  90 tablet    Refill:  1    Requested drug refills are authorized, however, the patient needs further evaluation and/or laboratory testing before further refills are given. Ask her to make an appointment for this.    Order Specific Question:   Supervising Provider    Answer:   Danise Edge A [[id number]]   amLODipine (NORVASC) 5 MG tablet    Sig: Take 1 tablet (5 mg total) by mouth daily.    Dispense:  90 tablet    Refill:  1    Requested drug refills are authorized, however, the patient needs further evaluation and/or laboratory testing before further refills are given. Ask her to make an appointment for this.    Order Specific Question:   Supervising Provider    Answer:   Danise Edge A [[id number]]   atorvastatin (LIPITOR) 40 MG tablet    Sig: Take 1 tablet (40 mg total) by mouth daily.    Dispense:  90 tablet    Refill:  1    Order Specific Question:   Supervising Provider    Answer:   Danise Edge A [[id number]]   Semaglutide-Weight Management (WEGOVY) 0.25 MG/0.5ML SOAJ    Sig: Inject 0.25 mg into the skin once a week.    Dispense:  2 mL    Refill:  1    Order Specific Question:   Supervising Provider    Answer:   Danise Edge A [[id number]]

## 2022-12-02 NOTE — Assessment & Plan Note (Signed)
Stable on 50000 international weekly which she states she has been on long term.

## 2022-12-02 NOTE — Assessment & Plan Note (Signed)
Resume atorvastatin.  Discussed benefits of statin for secondary CV risk reduction.

## 2022-12-02 NOTE — Assessment & Plan Note (Signed)
BP Readings from Last 3 Encounters:  12/02/22 123/75  08/15/22 129/74  06/18/22 116/70   BP stable. Continue amlodipine and metoprolol.

## 2022-12-02 NOTE — Assessment & Plan Note (Signed)
:   Patient reports isolated head sweating, suggestive of hot flashes. Unclear menopausal status due to prior hysterectomy. -Start Citalopram 10mg  for one week, then increase to 20mg  daily. -Check thyroid function to rule out hyperthyroidism as a cause of sweating.

## 2022-12-02 NOTE — Telephone Encounter (Signed)
PA initiated via Covermymeds; KEY: [id number]. Awaiting determination.

## 2022-12-02 NOTE — Addendum Note (Signed)
Addended by: Wilford Corner on: 12/02/2022 02:02 PM   Modules accepted: Orders

## 2022-12-02 NOTE — Assessment & Plan Note (Signed)
Patient reports chronic fatigue and has a prior diagnosis of sleep apnea but is non-compliant with CPAP therapy due to discomfort and noise. -Refer to sleep specialist for evaluation and discussion of alternative treatment options, including nasal CPAP or oral appliance.

## 2022-12-04 ENCOUNTER — Other Ambulatory Visit (HOSPITAL_BASED_OUTPATIENT_CLINIC_OR_DEPARTMENT_OTHER): Payer: Self-pay

## 2022-12-04 NOTE — Telephone Encounter (Signed)
PA denied. Plan exclusion.   *Drug Not Covered/Plan Exclusion *Drug Not Covered/Plan Exclusion

## 2022-12-05 MED ORDER — OZEMPIC (0.25 OR 0.5 MG/DOSE) 2 MG/3ML ~~LOC~~ SOPN: 0.2500 mg | PEN_INJECTOR | SUBCUTANEOUS | 0 refills | Status: DC

## 2022-12-18 ENCOUNTER — Encounter (INDEPENDENT_AMBULATORY_CARE_PROVIDER_SITE_OTHER): Payer: Self-pay

## 2022-12-25 ENCOUNTER — Other Ambulatory Visit: Payer: Self-pay | Admitting: Family

## 2022-12-25 DIAGNOSIS — I10 Essential (primary) hypertension: Secondary | ICD-10-CM

## 2022-12-31 ENCOUNTER — Ambulatory Visit (INDEPENDENT_AMBULATORY_CARE_PROVIDER_SITE_OTHER): Payer: 59 | Admitting: *Deleted

## 2022-12-31 ENCOUNTER — Telehealth: Payer: Self-pay | Admitting: *Deleted

## 2022-12-31 DIAGNOSIS — Z0000 Encounter for general adult medical examination without abnormal findings: Secondary | ICD-10-CM

## 2022-12-31 DIAGNOSIS — Z1211 Encounter for screening for malignant neoplasm of colon: Secondary | ICD-10-CM | POA: Diagnosis not present

## 2022-12-31 NOTE — Progress Notes (Signed)
Subjective:   [name] Steele is a 57 y.o. female who presents for an Initial Medicare Annual Wellness Visit.  Visit Complete: Virtual  I connected with  [name] Steele on 12/31/22 by a audio enabled telemedicine application and verified that I am speaking with the correct person using two identifiers.  Patient Location: Home  Provider Location: Office/Clinic  I discussed the limitations of evaluation and management by telemedicine. The patient expressed understanding and agreed to proceed.   Review of Systems     Cardiac Risk Factors include: dyslipidemia;hypertension;obesity (BMI >30kg/m2)     Objective:   Vital Signs: Unable to obtain new vitals due to this being a telehealth visit.  Today's Vitals   12/31/22 0904  PainSc: 7    There is no height or weight on file to calculate BMI.     12/31/2022    9:13 AM 04/23/2022    3:02 PM 03/15/2022    2:42 PM 03/06/2022    9:07 AM 03/04/2022    9:55 PM 12/25/2021    3:22 PM 12/23/2021    7:21 PM  Advanced Directives  Does Patient Have a Medical Advance Directive? No No No No No No No  Would patient like information on creating a medical advance directive? No - Patient declined No - Patient declined  No - Guardian declined       Current Medications (verified) Outpatient Encounter Medications as of 12/31/2022  Medication Sig   amLODipine (NORVASC) 5 MG tablet TAKE 1 TABLET(5 MG) BY MOUTH DAILY   aspirin EC 325 MG tablet Take 325 mg by mouth daily.   atorvastatin (LIPITOR) 40 MG tablet Take 1 tablet (40 mg total) by mouth daily.   cetirizine (ZYRTEC) 10 MG tablet TK 1 T PO QD   Cholecalciferol 50000 units TABS Take by mouth. (Patient not taking: Reported on 08/15/2022)   citalopram (CELEXA) 20 MG tablet Take 1 tablet (20 mg total) by mouth daily.   cyanocobalamin (,VITAMIN B-12,) 1000 MCG/ML injection Inject into the muscle.   diclofenac sodium (VOLTAREN) 1 % GEL Apply topically 4 (four) times daily.   ergocalciferol  (VITAMIN D2) 1.25 MG (50000 UT) capsule Take 50,000 Units by mouth once a week.   HYDROcodone-acetaminophen (NORCO) 10-325 MG tablet Take 1 tablet by mouth every 6 (six) hours as needed.   metoprolol succinate (TOPROL-XL) 25 MG 24 hr tablet TAKE 1 TABLET(25 MG) BY MOUTH DAILY   scopolamine (TRANSDERM-SCOP) 1 MG/3DAYS Place 1 patch (1.5 mg total) onto the skin every 3 (three) days.   Semaglutide,0.25 or 0.5MG /DOS, (OZEMPIC, 0.25 OR 0.5 MG/DOSE,) 2 MG/3ML SOPN Inject 0.25 mg into the skin once a week.   tiZANidine (ZANAFLEX) 4 MG tablet Take 4-8 mg by mouth at bedtime.   No facility-administered encounter medications on file as of 12/31/2022.    Allergies (verified) Lyrica [pregabalin]   History: Past Medical History:  Diagnosis Date   Chronic low back pain    Hypertension    Myocardial infarction (HCC) 2023   Sleep apnea    Past Surgical History:  Procedure Laterality Date   ABDOMINAL HYSTERECTOMY     BREAST REDUCTION SURGERY     FOOT SURGERY     GASTRIC BYPASS     TUBAL LIGATION     Family History  Problem Relation Age of Onset   COPD Mother    Throat cancer Mother    Diabetes Father    Kidney disease Father    Breast cancer Sister    Social History  Socioeconomic History   Marital status: Single    Spouse name: Not on file   Number of children: Not on file   Years of education: Not on file   Highest education level: Not on file  Occupational History   Not on file  Tobacco Use   Smoking status: Never   Smokeless tobacco: Never  Substance and Sexual Activity   Alcohol use: No   Drug use: No   Sexual activity: Yes    Birth control/protection: Surgical  Other Topics Concern   Not on file  Social History Narrative   3 sons (one set of twins), grown   10 grandchildren (family is local)   Lives with father or her children   Raising her great nephew (this is a stressor)   Works in Therapist, occupational,  runs a venue for parties   Completed some college   Enjoys cooking    No pets    Social Determinants of Corporate investment banker Strain: Medium Risk (12/31/2022)   Overall Financial Resource Strain (CARDIA)    Difficulty of Paying Living Expenses: Somewhat hard  Food Insecurity: No Food Insecurity (12/31/2022)   Hunger Vital Sign    Worried About Running Out of Food in the Last Year: Never true    Ran Out of Food in the Last Year: Never true  Transportation Needs: No Transportation Needs (12/31/2022)   PRAPARE - Administrator, Civil Service (Medical): No    Lack of Transportation (Non-Medical): No  Physical Activity: Inactive (12/31/2022)   Exercise Vital Sign    Days of Exercise per Week: 0 days    Minutes of Exercise per Session: 0 min  Stress: Stress Concern Present (12/31/2022)   [employer] of Occupational Health - Occupational Stress Questionnaire    Feeling of Stress : Very much  Social Connections: Moderately Isolated (12/31/2022)   Social Connection and Isolation Panel [NHANES]    Frequency of Communication with Friends and Family: More than three times a week    Frequency of Social Gatherings with Friends and Family: Once a week    Attends Religious Services: More than 4 times per year    Active Member of [employer] or Organizations: No    Attends Engineer, structural: Never    Marital Status: Never married    Tobacco Counseling Counseling given: Not Answered   Clinical Intake:  Pre-visit preparation completed: Yes  Pain : 0-10 Pain Score: 7  Pain Location: Back Pain Orientation: Lower Pain Descriptors / Indicators: Aching Pain Onset: More than a month ago Pain Frequency: Constant  Nutritional Risks: None Diabetes: No  How often do you need to have someone help you when you read instructions, pamphlets, or other written materials from your doctor or pharmacy?: 1 - Never  Interpreter Needed?: No  Information entered by :: Donne Anon, CMA   Activities of Daily Living    12/31/2022    9:05 AM  In  your present state of health, do you have any difficulty performing the following activities:  Hearing? 1  Comment hearing loss in left ear  Vision? 0  Difficulty concentrating or making decisions? 0  Walking or climbing stairs? 0  Dressing or bathing? 0  Doing errands, shopping? 0  Preparing Food and eating ? N  Using the Toilet? N  In the past six months, have you accidently leaked urine? N  Do you have problems with loss of bowel control? N  Managing your Medications? N  Managing your  Finances? N  Housekeeping or managing your Housekeeping? N    Patient Care Team: Sandford Craze, NP as PCP - General (Internal Medicine)  Indicate any recent Medical Services you may have received from other than [facility] providers in the past year (date may be approximate).     Assessment:   This is a routine wellness examination for [place].  Hearing/Vision screen No results found.  Dietary issues and exercise activities discussed:     Goals Addressed   None    Depression Screen    12/31/2022    9:13 AM 03/12/2022   10:21 AM  PHQ 2/9 Scores  PHQ - 2 Score 0 0    Fall Risk    12/31/2022    9:07 AM  Fall Risk   Falls in the past year? 0  Number falls in past yr: 0  Injury with Fall? 0  Risk for fall due to : No Fall Risks  Follow up Falls evaluation completed    MEDICARE RISK AT HOME: Medicare Risk at Home Any stairs in or around the home?: Yes If so, are there any without handrails?: No Home free of loose throw rugs in walkways, pet beds, electrical cords, etc?: Yes Adequate lighting in your home to reduce risk of falls?: Yes Life alert?: No Use of a cane, walker or w/c?: No Grab bars in the bathroom?: No Shower chair or bench in shower?: No Elevated toilet seat or a handicapped toilet?: No  TIMED UP AND GO:  Was the test performed? No    Cognitive Function:    12/31/2022    9:20 AM  MMSE - Mini Mental State Exam  Not completed: Unable to complete         Immunizations Immunization History  Administered Date(s) Administered   Influenza, Quadrivalent, Recombinant, Inj, Pf 05/13/2013   Influenza-Unspecified 02/12/2011, 01/19/2014   PFIZER(Purple Top)SARS-COV-2 Vaccination 07/21/2019, 08/16/2019   Tdap 07/12/2011    TDAP status: Due, Education has been provided regarding the importance of this vaccine. Advised may receive this vaccine at local pharmacy or Health Dept. Aware to provide a copy of the vaccination record if obtained from local pharmacy or Health Dept. Verbalized acceptance and understanding.  Flu Vaccine status: Due, Education has been provided regarding the importance of this vaccine. Advised may receive this vaccine at local pharmacy or Health Dept. Aware to provide a copy of the vaccination record if obtained from local pharmacy or Health Dept. Verbalized acceptance and understanding.  Covid-19 vaccine status: Information provided on how to obtain vaccines.   Qualifies for Shingles Vaccine? Yes   Zostavax completed No   Shingrix Completed?: No.    Education has been provided regarding the importance of this vaccine. Patient has been advised to call insurance company to determine out of pocket expense if they have not yet received this vaccine. Advised may also receive vaccine at local pharmacy or Health Dept. Verbalized acceptance and understanding.  Screening Tests Health Maintenance  Topic Date Due   Medicare Annual Wellness (AWV)  Never done   Zoster Vaccines- Shingrix (1 of 2) Never done   Colonoscopy  Never done   MAMMOGRAM  Never done   COVID-19 Vaccine (3 - Pfizer risk series) 09/13/2019   DTaP/Tdap/Td (2 - Td or Tdap) 07/11/2021   INFLUENZA VACCINE  12/04/2022   HPV VACCINES  Aged Out   PAP SMEAR-Modifier  Discontinued   Hepatitis C Screening  Discontinued   HIV Screening  Discontinued    Health Maintenance  Health Maintenance  Due  Topic Date Due   Medicare Annual Wellness (AWV)  Never done   Zoster  Vaccines- Shingrix (1 of 2) Never done   Colonoscopy  Never done   MAMMOGRAM  Never done   COVID-19 Vaccine (3 - Pfizer risk series) 09/13/2019   DTaP/Tdap/Td (2 - Td or Tdap) 07/11/2021   INFLUENZA VACCINE  12/04/2022    Colorectal cancer screening: Type of screening: Colonoscopy. Completed 2017. Repeat every 5 years  Mammogram status: Ordered 12/02/22. Pt provided with contact info and advised to call to schedule appt.   Bone Density status: due at age 50  Lung Cancer Screening: (Low Dose CT Chest recommended if Age 81-80 years, 20 pack-year currently smoking OR have quit w/in 15years.) does not qualify.   Additional Screening:  Hepatitis C Screening: does qualify; Completed N/a  Vision Screening: Recommended annual ophthalmology exams for early detection of glaucoma and other disorders of the eye. Is the patient up to date with their annual eye exam?  No  Who is the provider or what is the name of the office in which the patient attends annual eye exams? Doesn't have eye doctor at this time If pt is not established with a provider, would they like to be referred to a provider to establish care? No .   Dental Screening: Recommended annual dental exams for proper oral hygiene  Diabetic Foot Exam: N/a  Community Resource Referral / Chronic Care Management: CRR required this visit?  No   CCM required this visit?  No     Plan:     I have personally reviewed and noted the following in the patient's chart:   Medical and social history Use of alcohol, tobacco or illicit drugs  Current medications and supplements including opioid prescriptions. Patient is currently taking opioid prescriptions. Information provided to patient regarding non-opioid alternatives. Patient advised to discuss non-opioid treatment plan with their provider. Functional ability and status Nutritional status Physical activity Advanced directives List of other physicians Hospitalizations, surgeries, and  ER visits in previous 12 months Vitals Screenings to include cognitive, depression, and falls Referrals and appointments  In addition, I have reviewed and discussed with patient certain preventive protocols, quality metrics, and best practice recommendations. A written personalized care plan for preventive services as well as general preventive health recommendations were provided to patient.     Donne Anon, CMA   12/31/2022   After Visit Summary: (MyChart) Due to this being a telephonic visit, the after visit summary with patients personalized plan was offered to patient via MyChart   Nurse Notes: None

## 2022-12-31 NOTE — Telephone Encounter (Signed)
Thank you :)

## 2022-12-31 NOTE — Patient Instructions (Signed)
[name] Steele , Thank you for taking time to come for your Medicare Wellness Visit. I appreciate your ongoing commitment to your health goals. Please review the following plan we discussed and let me know if I can assist you in the future.   These are the goals we discussed:  Goals   None     This is a list of the screening recommended for you and due dates:  Health Maintenance  Topic Date Due   Zoster (Shingles) Vaccine (1 of 2) Never done   Colon Cancer Screening  Never done   Mammogram  Never done   COVID-19 Vaccine (3 - Pfizer risk series) 09/13/2019   DTaP/Tdap/Td vaccine (2 - Td or Tdap) 07/11/2021   Flu Shot  12/04/2022   Medicare Annual Wellness Visit  12/31/2023   HPV Vaccine  Aged Out   Pap Smear  Discontinued   Hepatitis C Screening  Discontinued   HIV Screening  Discontinued      Next appointment: Follow up in one year for your annual wellness visit.   Preventive Care 40-64 Years, Female Preventive care refers to lifestyle choices and visits with your health care provider that can promote health and wellness. What does preventive care include? A yearly physical exam. This is also called an annual well check. Dental exams once or twice a year. Routine eye exams. Ask your health care provider how often you should have your eyes checked. Personal lifestyle choices, including: Daily care of your teeth and gums. Regular physical activity. Eating a healthy diet. Avoiding tobacco and drug use. Limiting alcohol use. Practicing safe sex. Taking low-dose aspirin daily starting at age 19. Taking vitamin and mineral supplements as recommended by your health care provider. What happens during an annual well check? The services and screenings done by your health care provider during your annual well check will depend on your age, overall health, lifestyle risk factors, and family history of disease. Counseling  Your health care provider may ask you questions about  your: Alcohol use. Tobacco use. Drug use. Emotional well-being. Home and relationship well-being. Sexual activity. Eating habits. Work and work Astronomer. Method of birth control. Menstrual cycle. Pregnancy history. Screening  You may have the following tests or measurements: Height, weight, and BMI. Blood pressure. Lipid and cholesterol levels. These may be checked every 5 years, or more frequently if you are over 12 years old. Skin check. Lung cancer screening. You may have this screening every year starting at age 29 if you have a 30-pack-year history of smoking and currently smoke or have quit within the past 15 years. Fecal occult blood test (FOBT) of the stool. You may have this test every year starting at age 47. Flexible sigmoidoscopy or colonoscopy. You may have a sigmoidoscopy every 5 years or a colonoscopy every 10 years starting at age 68. Hepatitis C blood test. Hepatitis B blood test. Sexually transmitted disease (STD) testing. Diabetes screening. This is done by checking your blood sugar (glucose) after you have not eaten for a while (fasting). You may have this done every 1-3 years. Mammogram. This may be done every 1-2 years. Talk to your health care provider about when you should start having regular mammograms. This may depend on whether you have a family history of breast cancer. BRCA-related cancer screening. This may be done if you have a family history of breast, ovarian, tubal, or peritoneal cancers. Pelvic exam and Pap test. This may be done every 3 years starting at age 89. Starting  at age 65, this may be done every 5 years if you have a Pap test in combination with an HPV test. Bone density scan. This is done to screen for osteoporosis. You may have this scan if you are at high risk for osteoporosis. Discuss your test results, treatment options, and if necessary, the need for more tests with your health care provider. Vaccines  Your health care provider may  recommend certain vaccines, such as: Influenza vaccine. This is recommended every year. Tetanus, diphtheria, and acellular pertussis (Tdap, Td) vaccine. You may need a Td booster every 10 years. Zoster vaccine. You may need this after age 53. Pneumococcal 13-valent conjugate (PCV13) vaccine. You may need this if you have certain conditions and were not previously vaccinated. Pneumococcal polysaccharide (PPSV23) vaccine. You may need one or two doses if you smoke cigarettes or if you have certain conditions. Talk to your health care provider about which screenings and vaccines you need and how often you need them. This information is not intended to replace advice given to you by your health care provider. Make sure you discuss any questions you have with your health care provider. Document Released: 05/18/2015 Document Revised: 01/09/2016 Document Reviewed: 02/20/2015 Elsevier Interactive Patient Education  2017 [employer].    Fall Prevention in the Home Falls can cause injuries. They can happen to people of all ages. There are many things you can do to make your home safe and to help prevent falls. What can I do on the outside of my home? Regularly fix the edges of walkways and driveways and fix any cracks. Remove anything that might make you trip as you walk through a door, such as a raised step or threshold. Trim any bushes or trees on the path to your home. Use bright outdoor lighting. Clear any walking paths of anything that might make someone trip, such as rocks or tools. Regularly check to see if handrails are loose or broken. Make sure that both sides of any steps have handrails. Any raised decks and porches should have guardrails on the edges. Have any leaves, snow, or ice cleared regularly. Use sand or salt on walking paths during winter. Clean up any spills in your garage right away. This includes oil or grease spills. What can I do in the bathroom? Use night lights. Install  grab bars by the toilet and in the tub and shower. Do not use towel bars as grab bars. Use non-skid mats or decals in the tub or shower. If you need to sit down in the shower, use a plastic, non-slip stool. Keep the floor dry. Clean up any water that spills on the floor as soon as it happens. Remove soap buildup in the tub or shower regularly. Attach bath mats securely with double-sided non-slip rug tape. Do not have throw rugs and other things on the floor that can make you trip. What can I do in the bedroom? Use night lights. Make sure that you have a light by your bed that is easy to reach. Do not use any sheets or blankets that are too big for your bed. They should not hang down onto the floor. Have a firm chair that has side arms. You can use this for support while you get dressed. Do not have throw rugs and other things on the floor that can make you trip. What can I do in the kitchen? Clean up any spills right away. Avoid walking on wet floors. Keep items that you use a  lot in easy-to-reach places. If you need to reach something above you, use a strong step stool that has a grab bar. Keep electrical cords out of the way. Do not use floor polish or wax that makes floors slippery. If you must use wax, use non-skid floor wax. Do not have throw rugs and other things on the floor that can make you trip. What can I do with my stairs? Do not leave any items on the stairs. Make sure that there are handrails on both sides of the stairs and use them. Fix handrails that are broken or loose. Make sure that handrails are as long as the stairways. Check any carpeting to make sure that it is firmly attached to the stairs. Fix any carpet that is loose or worn. Avoid having throw rugs at the top or bottom of the stairs. If you do have throw rugs, attach them to the floor with carpet tape. Make sure that you have a light switch at the top of the stairs and the bottom of the stairs. If you do not have  them, ask someone to add them for you. What else can I do to help prevent falls? Wear shoes that: Do not have high heels. Have rubber bottoms. Are comfortable and fit you well. Are closed at the toe. Do not wear sandals. If you use a stepladder: Make sure that it is fully opened. Do not climb a closed stepladder. Make sure that both sides of the stepladder are locked into place. Ask someone to hold it for you, if possible. Clearly mark and make sure that you can see: Any grab bars or handrails. First and last steps. Where the edge of each step is. Use tools that help you move around (mobility aids) if they are needed. These include: Canes. Walkers. Scooters. Crutches. Turn on the lights when you go into a dark area. Replace any light bulbs as soon as they burn out. Set up your furniture so you have a clear path. Avoid moving your furniture around. If any of your floors are uneven, fix them. If there are any pets around you, be aware of where they are. Review your medicines with your doctor. Some medicines can make you feel dizzy. This can increase your chance of falling. Ask your doctor what other things that you can do to help prevent falls. This information is not intended to replace advice given to you by your health care provider. Make sure you discuss any questions you have with your health care provider. Document Released: 02/15/2009 Document Revised: 09/27/2015 Document Reviewed: 05/26/2014 Elsevier Interactive Patient Education  2017 [employer].

## 2022-12-31 NOTE — Addendum Note (Signed)
Addended by: Sandford Craze on: 12/31/2022 02:02 PM   Modules accepted: Orders

## 2022-12-31 NOTE — Addendum Note (Signed)
Addended by: Sandford Craze on: 12/31/2022 12:35 PM   Modules accepted: Orders

## 2022-12-31 NOTE — Telephone Encounter (Signed)
During AWV today, pt complained of nausea/vomiting for several days.  She requested the name of number of her GI doc that she had seen in 2022 for this same problem.  I advised her that she was also due for CRC screen and pt said she would bring that up with GI. Number for [facility] given to pt.

## 2023-01-13 ENCOUNTER — Telehealth: Payer: Self-pay | Admitting: Family

## 2023-01-13 ENCOUNTER — Ambulatory Visit (INDEPENDENT_AMBULATORY_CARE_PROVIDER_SITE_OTHER): Payer: 59 | Admitting: Family

## 2023-01-13 ENCOUNTER — Encounter: Payer: Self-pay | Admitting: Family

## 2023-01-13 VITALS — BP 117/68 | HR 90 | Temp 98.3°F | Resp 16 | Ht 66.0 in | Wt 192.0 lb

## 2023-01-13 DIAGNOSIS — G5603 Carpal tunnel syndrome, bilateral upper limbs: Secondary | ICD-10-CM

## 2023-01-13 DIAGNOSIS — E669 Obesity, unspecified: Secondary | ICD-10-CM

## 2023-01-13 DIAGNOSIS — D509 Iron deficiency anemia, unspecified: Secondary | ICD-10-CM

## 2023-01-13 DIAGNOSIS — I2510 Atherosclerotic heart disease of native coronary artery without angina pectoris: Secondary | ICD-10-CM

## 2023-01-13 DIAGNOSIS — R232 Flushing: Secondary | ICD-10-CM

## 2023-01-13 DIAGNOSIS — E538 Deficiency of other specified B group vitamins: Secondary | ICD-10-CM

## 2023-01-13 DIAGNOSIS — I10 Essential (primary) hypertension: Secondary | ICD-10-CM | POA: Diagnosis not present

## 2023-01-13 MED ORDER — METOPROLOL SUCCINATE ER 25 MG PO TB24: 50.0000 mg | ORAL_TABLET | Freq: Every day | ORAL | Status: DC

## 2023-01-13 MED ORDER — "LUER LOCK SAFETY SYRINGES 25G X 1"" 3 ML MISC": 1.0000 | 0 refills | Status: DC

## 2023-01-13 MED ORDER — OZEMPIC (0.25 OR 0.5 MG/DOSE) 2 MG/3ML ~~LOC~~ SOPN: 0.5000 mg | PEN_INJECTOR | SUBCUTANEOUS | 0 refills | Status: DC

## 2023-01-13 MED ORDER — CYANOCOBALAMIN 1000 MCG/ML IJ SOLN: 1000.0000 ug | INTRAMUSCULAR | 1 refills | Status: DC

## 2023-01-13 NOTE — Telephone Encounter (Signed)
Patient notified of this information and she will like medication sent to her local pharmacy. She reports her daughter is a Engineer, civil (consulting) and can administer the IM injections. Rx sent for medication and supplies

## 2023-01-13 NOTE — Assessment & Plan Note (Signed)
BP looks good but she is bothered by her LE edema.  Will d/c amlodipine due to edema and increase toprol xl from 25mg  to 50mg  once daily. Send me her updated bp reading in a few days. My need to further titrate upward at that time.

## 2023-01-13 NOTE — Assessment & Plan Note (Signed)
Maintained on citalopram.

## 2023-01-13 NOTE — Assessment & Plan Note (Signed)
Resume b12 injections. This could also be a contributor to her hand numbness.

## 2023-01-13 NOTE — Assessment & Plan Note (Signed)
Lab Results  Component Value Date   WBC 5.3 08/15/2022   HGB 13.0 08/15/2022   HCT 38.6 08/15/2022   MCV 93.9 08/15/2022   PLT 297.0 08/15/2022   Last CBC WNL.

## 2023-01-13 NOTE — Telephone Encounter (Signed)
I was reviewing her chart after she left and looks like she stopped getting her b12 injections.  I would like her to restart. This may help with the numbness she is experiencing in her hands.  B12 IM weekly x 4 then monthly.  She can learn to self administer if she would like.   Also, she had asked me why she was taking citalopram- we started that for hot flashes.  Is it helping?   Also, it looks like she is overdue to follow up with her cardiologist, Dr. Kerby Nora. She should call to arrange a follow up visit please.

## 2023-01-13 NOTE — Assessment & Plan Note (Signed)
Recommended that she purchase OTC carpal tunnel wrist splints and wear while sleeping and as able throughout the day. If symptoms do not improve, will consider referral to specialist.

## 2023-01-13 NOTE — Patient Instructions (Signed)
VISIT SUMMARY:  During your visit, we discussed your concerns about foot swelling, hand numbness, and high blood pressure. We believe the foot swelling may be due to one of your blood pressure medications, and the hand numbness could be due to a condition called carpal tunnel syndrome. We also discussed your weight management and general health maintenance.  YOUR PLAN:  -HIGH BLOOD PRESSURE: Your blood pressure is well-controlled, but we suspect one of your medications may be causing foot swelling. We will increase the dose of metoprolol, a medication that helps lower blood pressure, and stop amlodipine. Please monitor your blood pressure at home and report the readings.  -CARPAL TUNNEL SYNDROME: Your hand numbness may be due to carpal tunnel syndrome, a condition that causes pressure on a nerve in your wrist. We recommend purchasing and using wrist braces, especially at night and during rest periods, to help alleviate your symptoms.  -WEIGHT MANAGEMENT: Your weight is stable on your current medication, Ozempic. We will increase the dose of this medication and ask you to monitor your weight and report any side effects.  -GENERAL HEALTH MAINTENANCE: You have a colonoscopy scheduled with your gastroenterologist, which is a test to check your colon's health. You also need to schedule a mammogram, a test to check for breast cancer, at the imaging center downstairs.  INSTRUCTIONS:  Please follow up in one month to assess your blood pressure control, tolerance to the increased dose of Ozempic, and symptoms of carpal tunnel syndrome.

## 2023-01-13 NOTE — Assessment & Plan Note (Signed)
On statin, beta blocker and aspirin. Overdue for follow up with cardiology- will advise pt to schedule.

## 2023-01-13 NOTE — Progress Notes (Signed)
Subjective:     Patient ID: [name] Steele, female    DOB: 12-May-1965, 57 y.o.   MRN: [id number]  Chief Complaint  Patient presents with   Obesity    Follow up on wegovy    HPI  Discussed the use of AI scribe software for clinical note transcription with the patient, who gave verbal consent to proceed.  History of Present Illness   The patient, with a history of a heart attack, presents with complaints of foot swelling, hand numbness, and high blood pressure. The foot swelling is bothersome and has been managed temporarily with hydrochlorothiazide, an old medication the patient had. The patient also experiences hand numbness, particularly in the morning, which improves after some time but recurs throughout the day. The numbness is severe enough to affect the patient's ability to grip objects. The patient's blood pressure is well-controlled on the current regimen of metoprolol and amlodipine, but the amlodipine is suspected to be causing the foot swelling. The patient also mentions catering and cooking, indicating a physically active lifestyle.          Health Maintenance Due  Topic Date Due   Zoster Vaccines- Shingrix (1 of 2) Never done   Colonoscopy  Never done   MAMMOGRAM  Never done   COVID-19 Vaccine (3 - Pfizer risk series) 09/13/2019   DTaP/Tdap/Td (2 - Td or Tdap) 07/11/2021    Past Medical History:  Diagnosis Date   Chronic low back pain    Hypertension    Myocardial infarction (HCC) 2023   Sleep apnea     Past Surgical History:  Procedure Laterality Date   ABDOMINAL HYSTERECTOMY     BREAST REDUCTION SURGERY     FOOT SURGERY     GASTRIC BYPASS     TUBAL LIGATION      Family History  Problem Relation Age of Onset   COPD Mother    Throat cancer Mother    Diabetes Father    Kidney disease Father    Breast cancer Sister     Social History   Socioeconomic History   Marital status: Single    Spouse name: Not on file   Number of children: Not on  file   Years of education: Not on file   Highest education level: Not on file  Occupational History   Not on file  Tobacco Use   Smoking status: Never   Smokeless tobacco: Never  Substance and Sexual Activity   Alcohol use: No   Drug use: No   Sexual activity: Yes    Birth control/protection: Surgical  Other Topics Concern   Not on file  Social History Narrative   3 sons (one set of twins), grown   10 grandchildren (family is local)   Lives with father or her children   Raising her great nephew (this is a stressor)   Works in Therapist, occupational,  runs a venue for parties   Completed some college   Enjoys cooking   No pets    Social Determinants of Health   Financial Resource Strain: Medium Risk (12/31/2022)   Overall Financial Resource Strain (CARDIA)    Difficulty of Paying Living Expenses: Somewhat hard  Food Insecurity: No Food Insecurity (12/31/2022)   Hunger Vital Sign    Worried About Running Out of Food in the Last Year: Never true    Ran Out of Food in the Last Year: Never true  Transportation Needs: No Transportation Needs (12/31/2022)   PRAPARE - Transportation  Lack of Transportation (Medical): No    Lack of Transportation (Non-Medical): No  Physical Activity: Inactive (12/31/2022)   Exercise Vital Sign    Days of Exercise per Week: 0 days    Minutes of Exercise per Session: 0 min  Stress: Stress Concern Present (12/31/2022)   [employer] of Occupational Health - Occupational Stress Questionnaire    Feeling of Stress : Very much  Social Connections: Moderately Isolated (12/31/2022)   Social Connection and Isolation Panel [NHANES]    Frequency of Communication with Friends and Family: More than three times a week    Frequency of Social Gatherings with Friends and Family: Once a week    Attends Religious Services: More than 4 times per year    Active Member of [employer] or Organizations: No    Attends Banker Meetings: Never    Marital Status: Never  married  Intimate Partner Violence: Not At Risk (12/31/2022)   Humiliation, Afraid, Rape, and Kick questionnaire    Fear of Current or Ex-Partner: No    Emotionally Abused: No    Physically Abused: No    Sexually Abused: No    Outpatient Medications Prior to Visit  Medication Sig Dispense Refill   aspirin EC 325 MG tablet Take 325 mg by mouth daily.     atorvastatin (LIPITOR) 40 MG tablet Take 1 tablet (40 mg total) by mouth daily. 90 tablet 1   cetirizine (ZYRTEC) 10 MG tablet TK 1 T PO QD  0   Cholecalciferol 50000 units TABS Take by mouth.     citalopram (CELEXA) 20 MG tablet Take 1 tablet (20 mg total) by mouth daily. 30 tablet 3   cyanocobalamin (,VITAMIN B-12,) 1000 MCG/ML injection Inject into the muscle.     diclofenac sodium (VOLTAREN) 1 % GEL Apply topically 4 (four) times daily.     ergocalciferol (VITAMIN D2) 1.25 MG (50000 UT) capsule Take 50,000 Units by mouth once a week.     HYDROcodone-acetaminophen (NORCO) 10-325 MG tablet Take 1 tablet by mouth every 6 (six) hours as needed. 120 tablet 0   scopolamine (TRANSDERM-SCOP) 1 MG/3DAYS Place 1 patch (1.5 mg total) onto the skin every 3 (three) days. 4 patch 0   tiZANidine (ZANAFLEX) 4 MG tablet Take 4-8 mg by mouth at bedtime.     amLODipine (NORVASC) 5 MG tablet TAKE 1 TABLET(5 MG) BY MOUTH DAILY 90 tablet 1   metoprolol succinate (TOPROL-XL) 25 MG 24 hr tablet TAKE 1 TABLET(25 MG) BY MOUTH DAILY 90 tablet 1   Semaglutide,0.25 or 0.5MG /DOS, (OZEMPIC, 0.25 OR 0.5 MG/DOSE,) 2 MG/3ML SOPN Inject 0.25 mg into the skin once a week. 3 mL 0   No facility-administered medications prior to visit.    Allergies  Allergen Reactions   Lyrica [Pregabalin]     nausea    ROS     Objective:    Physical Exam Constitutional:      General: She is not in acute distress.    Appearance: Normal appearance. She is well-developed.  HENT:     Head: Normocephalic and atraumatic.     Right Ear: External ear normal.     Left Ear:  External ear normal.  Eyes:     General: No scleral icterus. Neck:     Thyroid: No thyromegaly.  Cardiovascular:     Rate and Rhythm: Normal rate and regular rhythm.     Heart sounds: Normal heart sounds. No murmur heard. Pulmonary:     Effort: Pulmonary effort is normal.  No respiratory distress.     Breath sounds: Normal breath sounds. No wheezing.  Musculoskeletal:     Cervical back: Neck supple.  Skin:    General: Skin is warm and dry.  Neurological:     Mental Status: She is alert and oriented to person, place, and time.     Comments: Negative Tinels, + Phalans  Psychiatric:        Mood and Affect: Mood normal.        Behavior: Behavior normal.        Thought Content: Thought content normal.        Judgment: Judgment normal.      BP 117/68 (BP Location: Right Arm, Patient Position: Sitting, Cuff Size: Small)   Pulse 90   Temp 98.3 F (36.8 C) (Oral)   Resp 16   Ht 5\' 6"  (1.676 m)   Wt 192 lb (87.1 kg)   SpO2 98%   BMI 30.99 kg/m  Wt Readings from Last 3 Encounters:  01/13/23 192 lb (87.1 kg)  12/02/22 191 lb (86.6 kg)  08/15/22 192 lb (87.1 kg)       Assessment & Plan:   Problem List Items Addressed This Visit       Unprioritized   Obesity (BMI 30.0-34.9)    Wt Readings from Last 3 Encounters:  01/13/23 192 lb (87.1 kg)  12/02/22 191 lb (86.6 kg)  08/15/22 192 lb (87.1 kg)  She has not had any recent weight loss. Will increase ozempic to 0.5mg  weekly.        IDA (iron deficiency anemia)    Lab Results  Component Value Date   WBC 5.3 08/15/2022   HGB 13.0 08/15/2022   HCT 38.6 08/15/2022   MCV 93.9 08/15/2022   PLT 297.0 08/15/2022   Last CBC WNL.      Hypertension    BP looks good but she is bothered by her LE edema.  Will d/c amlodipine due to edema and increase toprol xl from 25mg  to 50mg  once daily. Send me her updated bp reading in a few days. My need to further titrate upward at that time.       Relevant Medications   metoprolol  succinate (TOPROL-XL) 25 MG 24 hr tablet   Hot flashes    Maintained on citalopram.       Relevant Medications   metoprolol succinate (TOPROL-XL) 25 MG 24 hr tablet   Coronary artery disease involving native heart without angina pectoris    On statin, beta blocker and aspirin. Overdue for follow up with cardiology- will advise pt to schedule.       Relevant Medications   metoprolol succinate (TOPROL-XL) 25 MG 24 hr tablet   Bilateral carpal tunnel syndrome - Primary    Recommended that she purchase OTC carpal tunnel wrist splints and wear while sleeping and as able throughout the day. If symptoms do not improve, will consider referral to specialist.      B12 deficiency    Resume b12 injections. This could also be a contributor to her hand numbness.      40 minutes spent on today's visit. Time was spent interviewing patient, counseling on medical issues/treatment and reviewing outside records.  I have discontinued Rosanne Sack. Barona's amLODipine. I have also changed her metoprolol succinate and Ozempic (0.25 or 0.5 MG/DOSE). Additionally, I am having her maintain her diclofenac sodium, cetirizine, cyanocobalamin, Cholecalciferol, tiZANidine, aspirin EC, HYDROcodone-acetaminophen, ergocalciferol, scopolamine, citalopram, and atorvastatin.  Meds ordered this encounter  Medications  metoprolol succinate (TOPROL-XL) 25 MG 24 hr tablet    Sig: Take 2 tablets (50 mg total) by mouth daily.    **Patient requests 90 days supply**    Order Specific Question:   Supervising Provider    Answer:   Danise Edge A [[id number]]   Semaglutide,0.25 or 0.5MG /DOS, (OZEMPIC, 0.25 OR 0.5 MG/DOSE,) 2 MG/3ML SOPN    Sig: Inject 0.5 mg into the skin once a week.    Dispense:  3 mL    Refill:  0    Order Specific Question:   Supervising Provider    Answer:   Danise Edge A [[id number]]

## 2023-01-13 NOTE — Progress Notes (Signed)
Subjective:     Patient ID: [name] Steele, female    DOB: Nov 21, 1965, 57 y.o.   MRN: [id number]  Chief Complaint  Patient presents with   Obesity    Follow up on wegovy    HPI  Discussed the use of AI scribe software for clinical note transcription with the patient, who gave verbal consent to proceed.  57 year old female presents to the clinic today for her monthly weight loss appointment since starting OZempic last month. She states that she is doing well with the medication.   She reports some numbness in hands that started about 1 months ago and it seems to be getting worse  Ozempic was just started 1 month ago. She is at 0.25mg  as of right now.  No nausea, vomiting, diarrhea or constipation noted form patient.   Patient states that she is trying to do more exercising and being mobile since starting the medication.   Patient feels like she is eating less than before.   Wt Readings from Last 3 Encounters:  01/13/23 192 lb (87.1 kg)  12/02/22 191 lb (86.6 kg)  08/15/22 192 lb (87.1 kg)     She states that she is seeing a gastroenterologist about her constipation and she was given medication to help with her constipation. And that she just started the medication and it is seeming to help with her bowels. The medication is Reglan 5mg  three times a day.      Health Maintenance Due  Topic Date Due   Zoster Vaccines- Shingrix (1 of 2) Never done   Colonoscopy  Never done   MAMMOGRAM  Never done   COVID-19 Vaccine (3 - Pfizer risk series) 09/13/2019   DTaP/Tdap/Td (2 - Td or Tdap) 07/11/2021    Past Medical History:  Diagnosis Date   Chronic low back pain    Hypertension    Myocardial infarction (HCC) 2023   Sleep apnea     Past Surgical History:  Procedure Laterality Date   ABDOMINAL HYSTERECTOMY     BREAST REDUCTION SURGERY     FOOT SURGERY     GASTRIC BYPASS     TUBAL LIGATION      Family History  Problem Relation Age of Onset   COPD Mother     Throat cancer Mother    Diabetes Father    Kidney disease Father    Breast cancer Sister     Social History   Socioeconomic History   Marital status: Single    Spouse name: Not on file   Number of children: Not on file   Years of education: Not on file   Highest education level: Not on file  Occupational History   Not on file  Tobacco Use   Smoking status: Never   Smokeless tobacco: Never  Substance and Sexual Activity   Alcohol use: No   Drug use: No   Sexual activity: Yes    Birth control/protection: Surgical  Other Topics Concern   Not on file  Social History Narrative   3 sons (one set of twins), grown   10 grandchildren (family is local)   Lives with father or her children   Raising her great nephew (this is a stressor)   Works in Therapist, occupational,  runs a venue for parties   Completed some college   Enjoys cooking   No pets    Social Determinants of Health   Financial Resource Strain: Medium Risk (12/31/2022)   Overall Financial Resource Strain (CARDIA)  Difficulty of Paying Living Expenses: Somewhat hard  Food Insecurity: No Food Insecurity (12/31/2022)   Hunger Vital Sign    Worried About Running Out of Food in the Last Year: Never true    Ran Out of Food in the Last Year: Never true  Transportation Needs: No Transportation Needs (12/31/2022)   PRAPARE - Administrator, Civil Service (Medical): No    Lack of Transportation (Non-Medical): No  Physical Activity: Inactive (12/31/2022)   Exercise Vital Sign    Days of Exercise per Week: 0 days    Minutes of Exercise per Session: 0 min  Stress: Stress Concern Present (12/31/2022)   [employer] of Occupational Health - Occupational Stress Questionnaire    Feeling of Stress : Very much  Social Connections: Moderately Isolated (12/31/2022)   Social Connection and Isolation Panel [NHANES]    Frequency of Communication with Friends and Family: More than three times a week    Frequency of Social Gatherings  with Friends and Family: Once a week    Attends Religious Services: More than 4 times per year    Active Member of [employer] or Organizations: No    Attends Banker Meetings: Never    Marital Status: Never married  Intimate Partner Violence: Not At Risk (12/31/2022)   Humiliation, Afraid, Rape, and Kick questionnaire    Fear of Current or Ex-Partner: No    Emotionally Abused: No    Physically Abused: No    Sexually Abused: No    Outpatient Medications Prior to Visit  Medication Sig Dispense Refill   amLODipine (NORVASC) 5 MG tablet TAKE 1 TABLET(5 MG) BY MOUTH DAILY 90 tablet 1   aspirin EC 325 MG tablet Take 325 mg by mouth daily.     atorvastatin (LIPITOR) 40 MG tablet Take 1 tablet (40 mg total) by mouth daily. 90 tablet 1   cetirizine (ZYRTEC) 10 MG tablet TK 1 T PO QD  0   Cholecalciferol 50000 units TABS Take by mouth.     citalopram (CELEXA) 20 MG tablet Take 1 tablet (20 mg total) by mouth daily. 30 tablet 3   cyanocobalamin (,VITAMIN B-12,) 1000 MCG/ML injection Inject into the muscle.     diclofenac sodium (VOLTAREN) 1 % GEL Apply topically 4 (four) times daily.     ergocalciferol (VITAMIN D2) 1.25 MG (50000 UT) capsule Take 50,000 Units by mouth once a week.     HYDROcodone-acetaminophen (NORCO) 10-325 MG tablet Take 1 tablet by mouth every 6 (six) hours as needed. 120 tablet 0   metoprolol succinate (TOPROL-XL) 25 MG 24 hr tablet TAKE 1 TABLET(25 MG) BY MOUTH DAILY 90 tablet 1   scopolamine (TRANSDERM-SCOP) 1 MG/3DAYS Place 1 patch (1.5 mg total) onto the skin every 3 (three) days. 4 patch 0   Semaglutide,0.25 or 0.5MG /DOS, (OZEMPIC, 0.25 OR 0.5 MG/DOSE,) 2 MG/3ML SOPN Inject 0.25 mg into the skin once a week. 3 mL 0   tiZANidine (ZANAFLEX) 4 MG tablet Take 4-8 mg by mouth at bedtime.     No facility-administered medications prior to visit.    Allergies  Allergen Reactions   Lyrica [Pregabalin]     nausea    Review of Systems  Constitutional:  Negative for  chills.  HENT:  Negative for ear pain and sore throat.   Eyes:  Negative for double vision.  Respiratory:  Negative for cough and wheezing.   Cardiovascular:  Negative for chest pain and palpitations.  Gastrointestinal:  Positive for constipation (some constipation  noted but taking medication for it). Negative for diarrhea, heartburn, nausea and vomiting.  Genitourinary:  Negative for dysuria and frequency.  Musculoskeletal:  Negative for back pain and joint pain.  Neurological:  Positive for tingling (noted in bilateral hands). Negative for dizziness and headaches.  Psychiatric/Behavioral:  Negative for depression, substance abuse and suicidal ideas.        Objective:    Physical Exam Constitutional:      Appearance: Normal appearance.  HENT:     Head: Normocephalic.     Nose: Nose normal.  Cardiovascular:     Rate and Rhythm: Normal rate and regular rhythm.     Pulses: Normal pulses.     Heart sounds: Normal heart sounds.  Pulmonary:     Effort: Pulmonary effort is normal.     Breath sounds: Normal breath sounds.  Musculoskeletal:     Cervical back: Normal range of motion.  Neurological:     General: No focal deficit present.     Mental Status: She is alert and oriented to person, place, and time. Mental status is at baseline.      There were no vitals taken for this visit. Wt Readings from Last 3 Encounters:  12/02/22 191 lb (86.6 kg)  08/15/22 192 lb (87.1 kg)  06/18/22 187 lb (84.8 kg)       Assessment & Plan:   Problem List Items Addressed This Visit   None   I am having [name] Steele maintain her diclofenac sodium, cetirizine, cyanocobalamin, Cholecalciferol, tiZANidine, aspirin EC, HYDROcodone-acetaminophen, ergocalciferol, scopolamine, citalopram, atorvastatin, Ozempic (0.25 or 0.5 MG/DOSE), amLODipine, and metoprolol succinate.  No orders of the defined types were placed in this encounter.

## 2023-01-13 NOTE — Assessment & Plan Note (Addendum)
Wt Readings from Last 3 Encounters:  01/13/23 192 lb (87.1 kg)  12/02/22 191 lb (86.6 kg)  08/15/22 192 lb (87.1 kg)  She has not had any recent weight loss. Will increase ozempic to 0.5mg  weekly.

## 2023-01-19 ENCOUNTER — Emergency Department (HOSPITAL_BASED_OUTPATIENT_CLINIC_OR_DEPARTMENT_OTHER)
Admission: EM | Admit: 2023-01-19 | Discharge: 2023-01-19 | Disposition: A | Discharge status: Home / Self Care | Payer: 59 | Attending: Emergency Medicine | Admitting: Emergency Medicine

## 2023-01-19 ENCOUNTER — Other Ambulatory Visit: Payer: Self-pay

## 2023-01-19 ENCOUNTER — Encounter (HOSPITAL_BASED_OUTPATIENT_CLINIC_OR_DEPARTMENT_OTHER): Payer: Self-pay | Admitting: Emergency Medicine

## 2023-01-19 DIAGNOSIS — M79641 Pain in right hand: Secondary | ICD-10-CM | POA: Diagnosis not present

## 2023-01-19 DIAGNOSIS — R52 Pain, unspecified: Secondary | ICD-10-CM

## 2023-01-19 DIAGNOSIS — Z79899 Other long term (current) drug therapy: Secondary | ICD-10-CM | POA: Diagnosis not present

## 2023-01-19 DIAGNOSIS — M79642 Pain in left hand: Secondary | ICD-10-CM | POA: Diagnosis not present

## 2023-01-19 DIAGNOSIS — M791 Myalgia, unspecified site: Secondary | ICD-10-CM | POA: Insufficient documentation

## 2023-01-19 DIAGNOSIS — Z20822 Contact with and (suspected) exposure to covid-19: Secondary | ICD-10-CM | POA: Insufficient documentation

## 2023-01-19 DIAGNOSIS — I10 Essential (primary) hypertension: Secondary | ICD-10-CM | POA: Diagnosis not present

## 2023-01-19 DIAGNOSIS — Z7982 Long term (current) use of aspirin: Secondary | ICD-10-CM | POA: Insufficient documentation

## 2023-01-19 LAB — SARS CORONAVIRUS 2 BY RT PCR: SARS Coronavirus 2 by RT PCR: NEGATIVE

## 2023-01-19 NOTE — ED Provider Notes (Signed)
[facility] Provider Note   CSN: [id number] Arrival date & time: 01/19/23  1804     History  Chief Complaint  Patient presents with   Generalized Body Aches    [name] Steele is a 57 y.o. female with a past medical history significant for hypertension, hyperlipidemia, and B12 deficiency who presents to the ED due to body aches.  Patient's sister tested positive for COVID yesterday.  Patient denies cough, fever, chest pain, and shortness of breath.  Admits to some chills.  Patient's at home COVID test today was negative.  Patient believes her body aches are from being tired from working the entire weekend.  She notes body aches have been present for the past few days. She also admits to fatigue.  Patient also admits to bilateral hand pain and intermittent edema.  Pain worse in the morning.  Pain improves after repetitive motion.  Patient was recently diagnosed with bilateral carpal tunnel by her PCP.  Patient is using Voltaren gel with relief. No fever or chills. No injury to hands.   History obtained from patient and past medical records. No interpreter used during encounter.       Home Medications Prior to Admission medications   Medication Sig Start Date End Date Taking? Authorizing Provider  aspirin EC 325 MG tablet Take 325 mg by mouth daily. 11/22/21   [provider]  atorvastatin (LIPITOR) 40 MG tablet Take 1 tablet (40 mg total) by mouth daily. 12/02/22   Sandford Craze, NP  cetirizine (ZYRTEC) 10 MG tablet TK 1 T PO QD 09/30/16   [provider]  Cholecalciferol 50000 units TABS Take by mouth.    [provider]  citalopram (CELEXA) 20 MG tablet Take 1 tablet (20 mg total) by mouth daily. 12/02/22   Sandford Craze, NP  cyanocobalamin (VITAMIN B12) 1000 MCG/ML injection Inject 1 mL (1,000 mcg total) into the muscle every 7 (seven) days. 01/13/23   Sandford Craze, NP  diclofenac sodium  (VOLTAREN) 1 % GEL Apply topically 4 (four) times daily.    [provider]  ergocalciferol (VITAMIN D2) 1.25 MG (50000 UT) capsule Take 50,000 Units by mouth once a week.    [provider]  HYDROcodone-acetaminophen (NORCO) 10-325 MG tablet Take 1 tablet by mouth every 6 (six) hours as needed. 01/30/22     metoprolol succinate (TOPROL-XL) 25 MG 24 hr tablet Take 2 tablets (50 mg total) by mouth daily. 01/13/23   Sandford Craze, NP  scopolamine (TRANSDERM-SCOP) 1 MG/3DAYS Place 1 patch (1.5 mg total) onto the skin every 3 (three) days. 11/04/22   Sandford Craze, NP  Semaglutide,0.25 or 0.5MG /DOS, (OZEMPIC, 0.25 OR 0.5 MG/DOSE,) 2 MG/3ML SOPN Inject 0.5 mg into the skin once a week. 01/13/23   Sandford Craze, NP  SYRINGE-NEEDLE, DISP, 3 ML (LUER LOCK SAFETY SYRINGES) 25G X 1" 3 ML MISC 1 each by Does not apply route every 7 (seven) days. 01/13/23   Sandford Craze, NP  tiZANidine (ZANAFLEX) 4 MG tablet Take 4-8 mg by mouth at bedtime. 12/11/21   [provider]      Allergies    Lyrica [pregabalin]    Review of Systems   Review of Systems  Constitutional:  Positive for fatigue.  Musculoskeletal:  Positive for arthralgias and myalgias.    Physical Exam Updated Vital Signs BP 105/81 (BP Location: Right Arm)   Pulse 77   Temp 98.8 F (37.1 C)   Resp 18   Ht  5\' 6"  (1.676 m)   Wt 86.2 kg   SpO2 99%   BMI 30.67 kg/m  Physical Exam Vitals and nursing note reviewed.  Constitutional:      General: She is not in acute distress.    Appearance: She is not ill-appearing.  HENT:     Head: Normocephalic.  Eyes:     Pupils: Pupils are equal, round, and reactive to light.  Cardiovascular:     Rate and Rhythm: Normal rate and regular rhythm.     Pulses: Normal pulses.     Heart sounds: Normal heart sounds. No murmur heard.    No friction rub. No gallop.  Pulmonary:     Effort: Pulmonary effort is normal.     Breath sounds: Normal breath sounds.   Abdominal:     General: Abdomen is flat. There is no distension.     Palpations: Abdomen is soft.     Tenderness: There is no abdominal tenderness. There is no guarding or rebound.  Musculoskeletal:        General: Normal range of motion.     Cervical back: Neck supple.     Comments: Normal bilateral hands without edema. Full ROM of all fingers. Radial pulse intact bilaterally.   Skin:    General: Skin is warm and dry.  Neurological:     General: No focal deficit present.     Mental Status: She is alert.  Psychiatric:        Mood and Affect: Mood normal.        Behavior: Behavior normal.     ED Results / Procedures / Treatments   Labs (all labs ordered are listed, but only abnormal results are displayed) Labs Reviewed  SARS CORONAVIRUS 2 BY RT PCR    EKG None  Radiology No results found.  Procedures Procedures    Medications Ordered in ED Medications - No data to display  ED Course/ Medical Decision Making/ A&P                                 Medical Decision Making Amount and/or Complexity of Data Reviewed Independent Historian: friend External Data Reviewed: notes.    Details: PCP note Labs: ordered. Decision-making details documented in ED Course.   57 year old female presents to the ED due to body aches and bilateral hand pain.  Patient recently diagnosed with bilateral carpal tunnel. No injury. No fever or chills.  Denies chest pain and shortness of breath.  Patient's sister tested positive for COVID-19 yesterday.  Upon arrival, vitals all within normal limits.  Patient in no acute distress.  Benign physical exam.  Normal bilateral hands.  Full range of motion of all fingers.  No edema.  Soft compartments.  No evidence of infection on exam.  Hand surgery number given to patient at discharge for further investigation of possible bilateral carpal tunnel. Lungs clear to auscultation bilaterally.  COVID-negative.  Offered to obtain labs however, patient declined.   Patient prefers to follow-up with PCP if symptoms do not improve over the next few days.  Patient stable for discharge. Strict ED precautions discussed with patient. Patient states understanding and agrees to plan. Patient discharged home in no acute distress and stable vitals  Lives at home Has PCP Hx HTN       Final Clinical Impression(s) / ED Diagnoses Final diagnoses:  Body aches  Pain in both hands    Rx / DC Orders  ED Discharge Orders     None         Jesusita Oka 01/19/23 2039    Laurence Spates, MD 01/20/23 1459

## 2023-01-19 NOTE — Discharge Instructions (Addendum)
It was a pleasure taking care of you today.  As discussed, your COVID test is negative. You were offered labs today; however you declined. If your symptoms do not improve, follow-up with PCP in 2 days. I have included the number of the hand surgeon. Call to schedule an appointment for further evaluation. Return to the ER for new or worsening symptoms.

## 2023-01-19 NOTE — ED Triage Notes (Signed)
States  her sister has covid and pt started to feel bad  with cough and cold yesterday, pt also states her hands are swollen and that she spoke to her dr  about that at her last appointment on 9/10

## 2023-01-20 ENCOUNTER — Telehealth: Payer: Self-pay | Admitting: Family

## 2023-01-20 ENCOUNTER — Inpatient Hospital Stay (HOSPITAL_BASED_OUTPATIENT_CLINIC_OR_DEPARTMENT_OTHER): Admission: RE | Admit: 2023-01-20 | Payer: 59 | Source: Ambulatory Visit

## 2023-01-20 DIAGNOSIS — G5603 Carpal tunnel syndrome, bilateral upper limbs: Secondary | ICD-10-CM

## 2023-01-20 NOTE — Telephone Encounter (Signed)
Pt states she was referred from the hospital to a hand specialist but has not heard anything back. She states she discussed this with pcp last week and would like her to place a new referral. Please advise.

## 2023-01-26 IMAGING — DX DG KNEE COMPLETE 4+V*R*
4 series · 4 of 4 positions shown · non-contrast
Comparison: None.

CLINICAL DATA: Right knee pain after fall 3 weeks ago

EXAM:
RIGHT KNEE - COMPLETE 4+ VIEW

[knee ap]
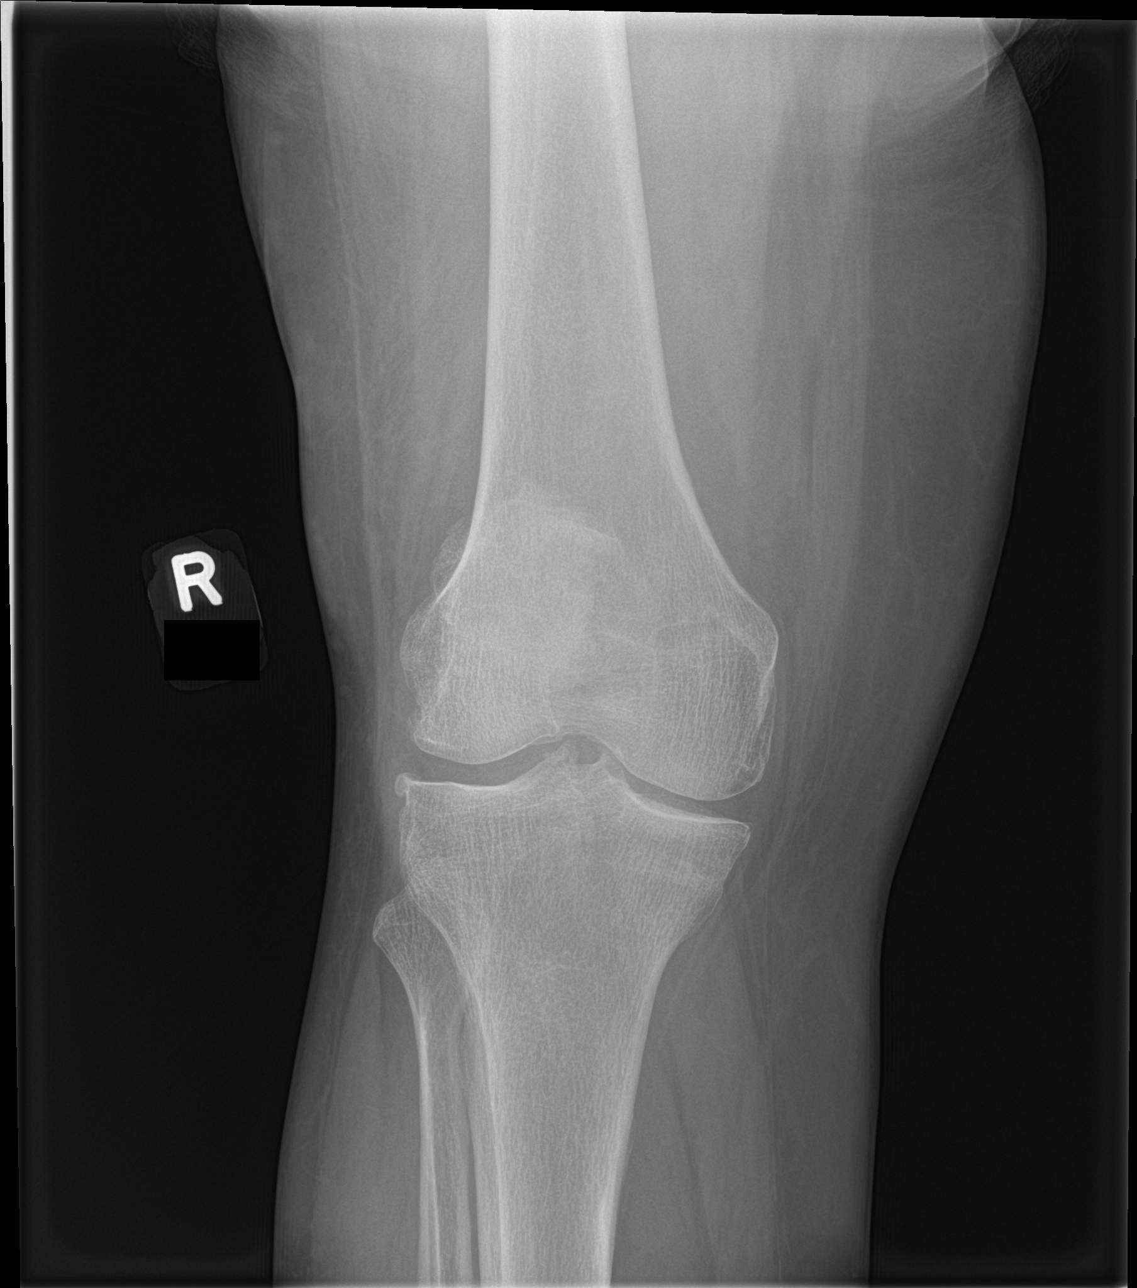

[knee lat]
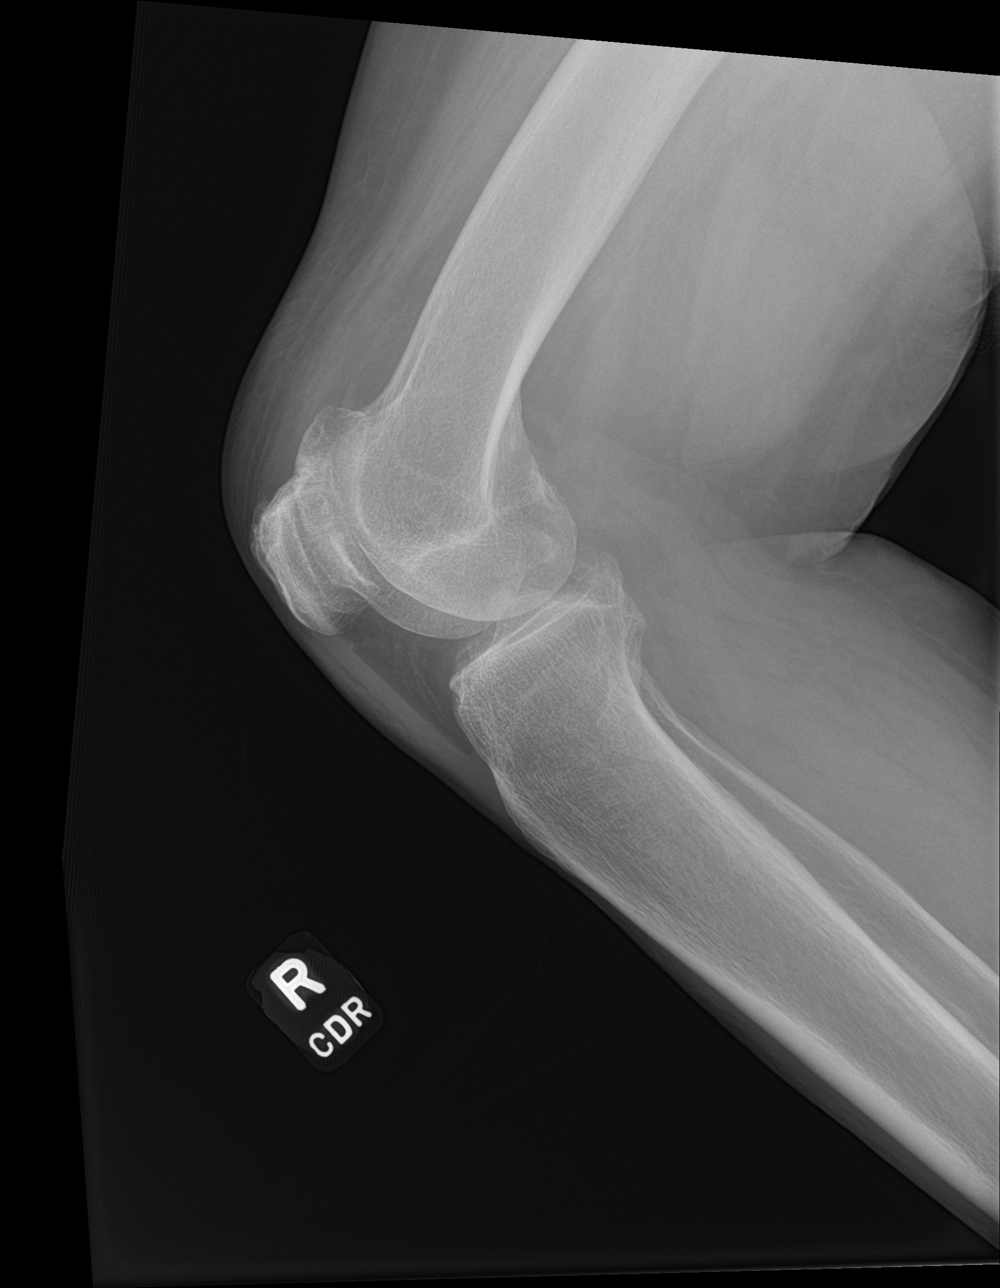

[knee obl (1 of 2)]
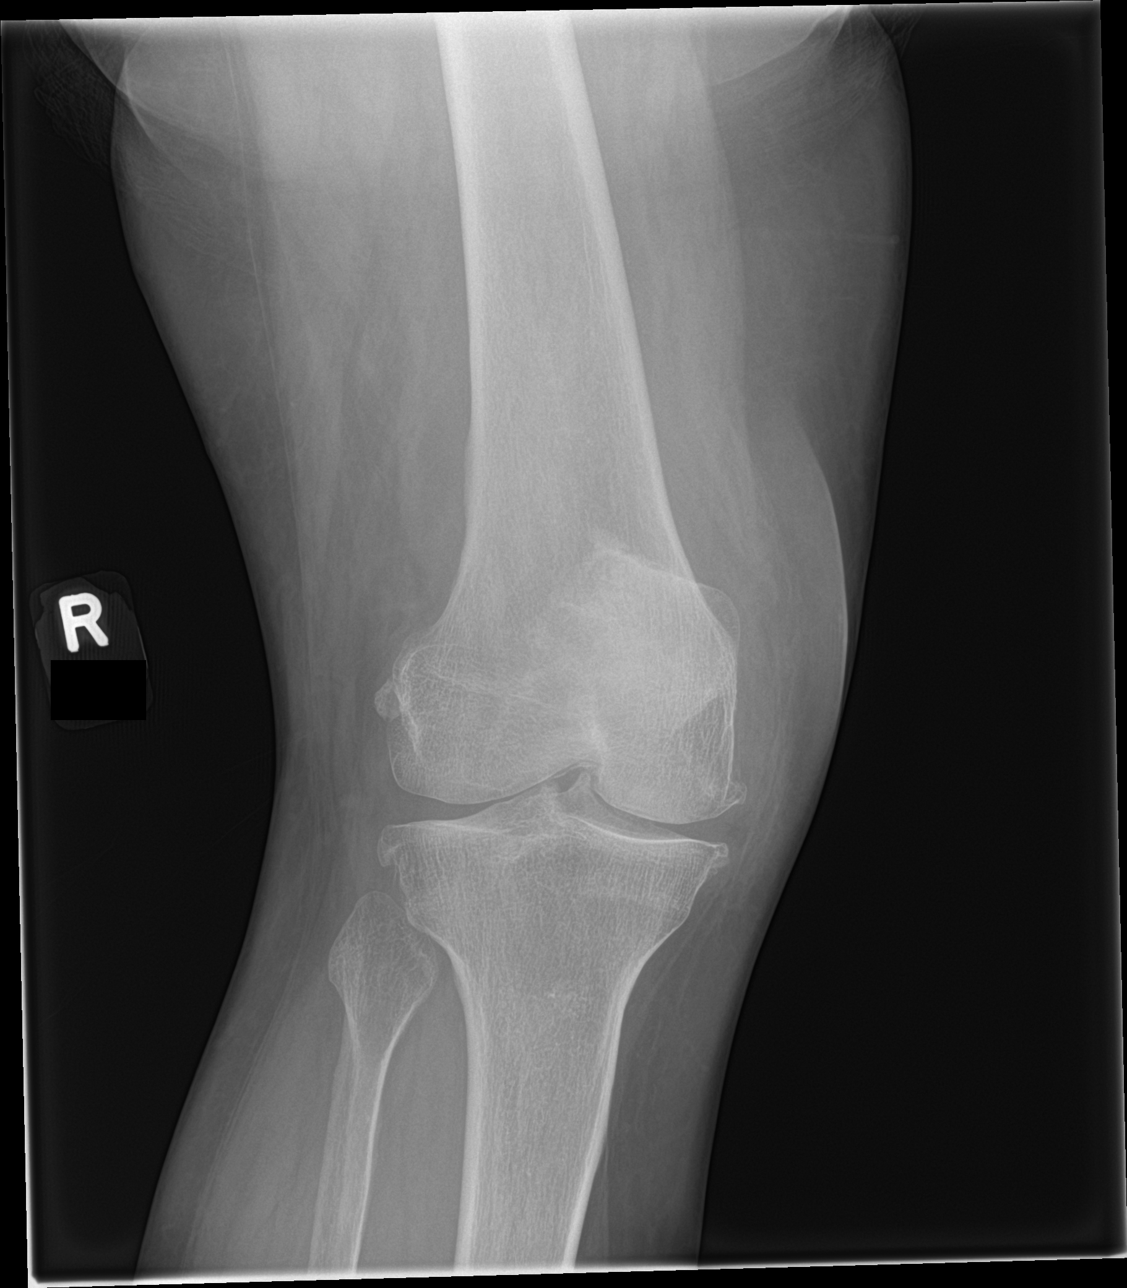

[knee obl (2 of 2)]
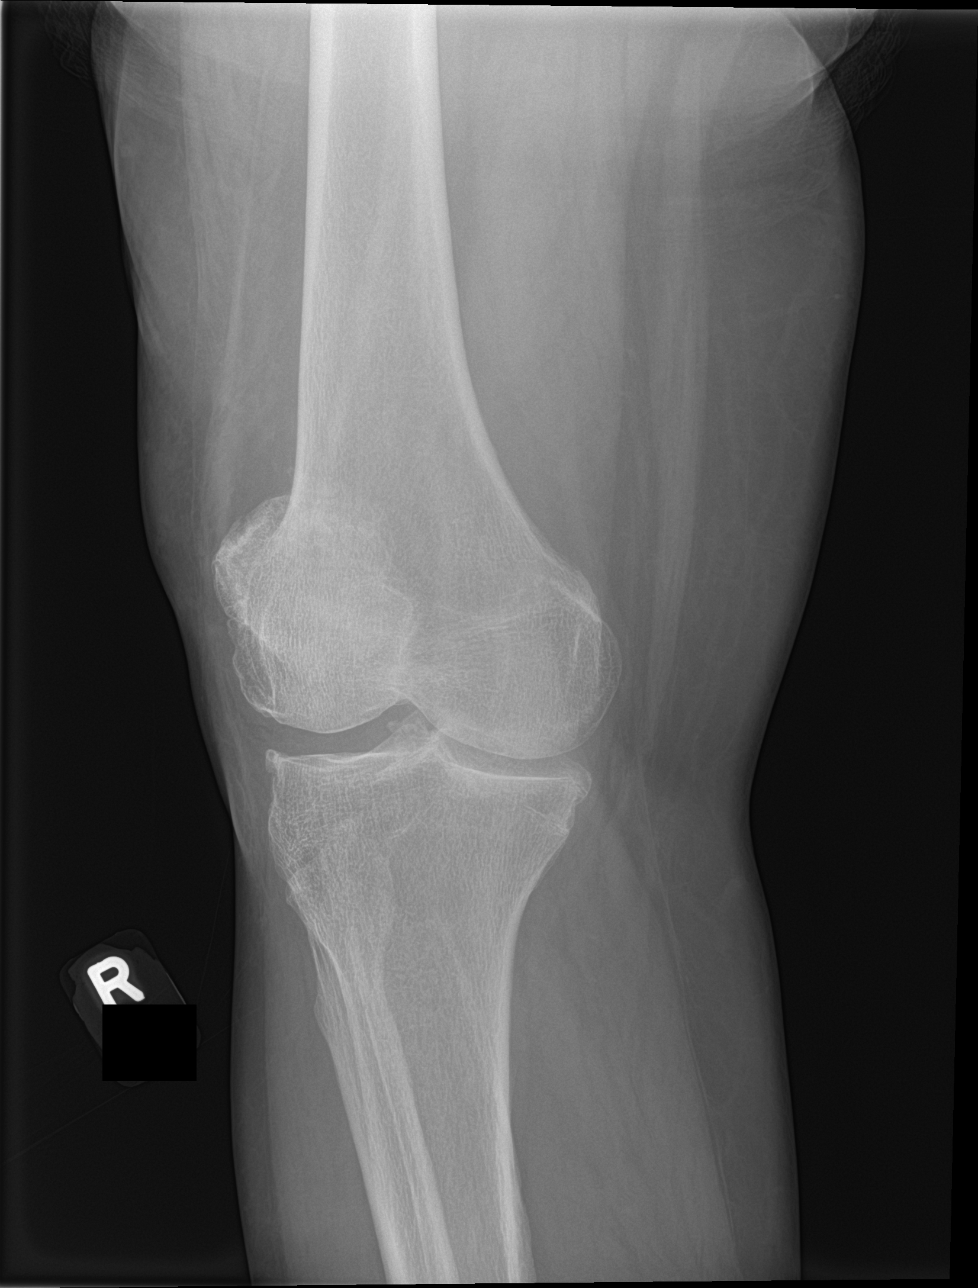

[4 of 4 positions shown; findings below may reference images not displayed]

FINDINGS: No evidence of fracture, dislocation, or joint effusion.
Tricompartment degenerative change. Patellar enthesophytes. Soft
tissues are unremarkable.
IMPRESSION: No evidence of fracture or dislocation.

Tricompartment DJD.

## 2023-02-16 ENCOUNTER — Other Ambulatory Visit: Payer: Self-pay | Admitting: Family

## 2023-02-24 ENCOUNTER — Telehealth: Payer: Self-pay | Admitting: Family

## 2023-02-24 NOTE — Telephone Encounter (Signed)
Pt called and stated that per [facility] pharmacy, they are needing another prescription sent in for Semaglutide,0.25 or 0.5MG /DOS, (OZEMPIC, 0.25 OR 0.5 MG/DOSE,) 2 MG/3ML SOPN. Pt is unsure of the reason. Please call and advise.

## 2023-02-26 MED ORDER — OZEMPIC (0.25 OR 0.5 MG/DOSE) 2 MG/3ML ~~LOC~~ SOPN: 0.5000 mg | PEN_INJECTOR | SUBCUTANEOUS | 1 refills | Status: DC

## 2023-02-26 NOTE — Telephone Encounter (Signed)
Rx sent 

## 2023-02-26 NOTE — Addendum Note (Signed)
Addended byConrad [place] D on: 02/26/2023 09:27 AM   Modules accepted: Orders

## 2023-04-17 ENCOUNTER — Encounter: Payer: Self-pay | Admitting: Family

## 2023-04-17 ENCOUNTER — Ambulatory Visit (INDEPENDENT_AMBULATORY_CARE_PROVIDER_SITE_OTHER): Payer: 59 | Admitting: Family

## 2023-04-17 VITALS — BP 103/65 | HR 86 | Temp 98.9°F | Resp 16 | Ht 66.0 in | Wt 180.0 lb

## 2023-04-17 DIAGNOSIS — K5909 Other constipation: Secondary | ICD-10-CM

## 2023-04-17 DIAGNOSIS — I10 Essential (primary) hypertension: Secondary | ICD-10-CM

## 2023-04-17 DIAGNOSIS — E663 Overweight: Secondary | ICD-10-CM

## 2023-04-17 DIAGNOSIS — R739 Hyperglycemia, unspecified: Secondary | ICD-10-CM

## 2023-04-17 DIAGNOSIS — E785 Hyperlipidemia, unspecified: Secondary | ICD-10-CM

## 2023-04-17 DIAGNOSIS — E538 Deficiency of other specified B group vitamins: Secondary | ICD-10-CM | POA: Diagnosis not present

## 2023-04-17 DIAGNOSIS — E559 Vitamin D deficiency, unspecified: Secondary | ICD-10-CM | POA: Diagnosis not present

## 2023-04-17 DIAGNOSIS — G4733 Obstructive sleep apnea (adult) (pediatric): Secondary | ICD-10-CM

## 2023-04-17 DIAGNOSIS — R232 Flushing: Secondary | ICD-10-CM

## 2023-04-17 MED ORDER — METOPROLOL SUCCINATE ER 25 MG PO TB24: 25.0000 mg | ORAL_TABLET | Freq: Every day | ORAL | 1 refills | Status: DC

## 2023-04-17 MED ORDER — CYANOCOBALAMIN 1000 MCG/ML IJ SOLN
1000.0000 ug | Freq: Once | INTRAMUSCULAR | Status: AC
  Administered 2023-04-17: 1000 ug via INTRAMUSCULAR

## 2023-04-17 MED ORDER — DICLOFENAC SODIUM 1 % EX GEL: 2.0000 g | Freq: Four times a day (QID) | CUTANEOUS | 5 refills | Status: DC | PRN

## 2023-04-17 NOTE — Progress Notes (Unsigned)
Subjective:     Patient ID: [name] Steele, female    DOB: 10-24-1965, 57 y.o.   MRN: [id number]  No chief complaint on file.   HPI  Discussed the use of AI scribe software for clinical note transcription with the patient, who gave verbal consent to proceed.  History of Present Illness   [name] Steele, a patient with a history of heart attack and vitamin deficiencies, presents for a routine follow up. She has been managing her health with a regimen of B12 injections, vitamin D supplements, and Ozempic for weight loss. She reports a weight loss of 12 pounds, which she attributes to the Ozempic. She also mentions that she has been experiencing swelling in her hands every morning, which she plans to address with her orthopedic doctor. She also reports discomfort in her feet when walking in the morning.  [name] Steele has been managing her vitamin D levels with a weekly dose of 50,000 units, but she ran out of her prescription last month. She has been on this dosage for several years, with a brief period of discontinuation. She also receives B12 injections at the clinic, with the last one being two months ago.  [name] Steele also mentions that she has been taking metoprolol for her heart condition, along with a daily aspirin. She reports no adverse effects from the Ozempic, but did experience some constipation, which she managed with Linzess. She also uses diclofenac gel for pain management.     BP Readings from Last 3 Encounters:  04/17/23 103/65  01/19/23 125/85  01/13/23 117/68     Wt Readings from Last 3 Encounters:  04/17/23 180 lb (81.6 kg)  01/19/23 190 lb (86.2 kg)  01/13/23 192 lb (87.1 kg)      Health Maintenance Due  Topic Date Due   Zoster Vaccines- Shingrix (1 of 2) Never done   Cervical Cancer Screening (HPV/Pap Cotest)  Never done   Colonoscopy  Never done   MAMMOGRAM  Never done   COVID-19 Vaccine (3 - Pfizer risk series) 09/13/2019   DTaP/Tdap/Td (2 - Td or Tdap) 07/11/2021    Past  Medical History:  Diagnosis Date   Chronic low back pain    Hypertension    Myocardial infarction (HCC) 2023   Sleep apnea     Past Surgical History:  Procedure Laterality Date   ABDOMINAL HYSTERECTOMY     BREAST REDUCTION SURGERY     FOOT SURGERY     GASTRIC BYPASS     TUBAL LIGATION      Family History  Problem Relation Age of Onset   COPD Mother    Throat cancer Mother    Diabetes Father    Kidney disease Father    Breast cancer Sister     Social History   Socioeconomic History   Marital status: Single    Spouse name: Not on file   Number of children: Not on file   Years of education: Not on file   Highest education level: Not on file  Occupational History   Not on file  Tobacco Use   Smoking status: Never    Passive exposure: Never   Smokeless tobacco: Never  Vaping Use   Vaping status: Former  Substance and Sexual Activity   Alcohol use: No   Drug use: No   Sexual activity: Yes    Birth control/protection: Surgical  Other Topics Concern   Not on file  Social History Narrative   3 sons (one set of twins), grown  10 grandchildren (family is local)   Lives with father or her children   Raising her great nephew (this is a stressor)   Works in Therapist, occupational,  runs a venue for parties   Completed some college   Enjoys cooking   No pets    Social Drivers of Corporate investment banker Strain: Medium Risk (12/31/2022)   Overall Financial Resource Strain (CARDIA)    Difficulty of Paying Living Expenses: Somewhat hard  Food Insecurity: No Food Insecurity (12/31/2022)   Hunger Vital Sign    Worried About Running Out of Food in the Last Year: Never true    Ran Out of Food in the Last Year: Never true  Transportation Needs: No Transportation Needs (12/31/2022)   PRAPARE - Administrator, Civil Service (Medical): No    Lack of Transportation (Non-Medical): No  Physical Activity: Inactive (12/31/2022)   Exercise Vital Sign    Days of Exercise per  Week: 0 days    Minutes of Exercise per Session: 0 min  Stress: Stress Concern Present (12/31/2022)   [employer] of Occupational Health - Occupational Stress Questionnaire    Feeling of Stress : Very much  Social Connections: Moderately Isolated (12/31/2022)   Social Connection and Isolation Panel [NHANES]    Frequency of Communication with Friends and Family: More than three times a week    Frequency of Social Gatherings with Friends and Family: Once a week    Attends Religious Services: More than 4 times per year    Active Member of [employer] or Organizations: No    Attends Banker Meetings: Never    Marital Status: Never married  Intimate Partner Violence: Not At Risk (12/31/2022)   Humiliation, Afraid, Rape, and Kick questionnaire    Fear of Current or Ex-Partner: No    Emotionally Abused: No    Physically Abused: No    Sexually Abused: No    Outpatient Medications Prior to Visit  Medication Sig Dispense Refill   aspirin EC 325 MG tablet Take 325 mg by mouth daily.     atorvastatin (LIPITOR) 40 MG tablet Take 1 tablet (40 mg total) by mouth daily. 90 tablet 1   cetirizine (ZYRTEC) 10 MG tablet TK 1 T PO QD  0   cyanocobalamin (VITAMIN B12) 1000 MCG/ML injection Inject 1 mL (1,000 mcg total) into the muscle every 7 (seven) days. 4 mL 1   diclofenac sodium (VOLTAREN) 1 % GEL Apply topically 4 (four) times daily.     HYDROcodone-acetaminophen (NORCO) 10-325 MG tablet Take 1 tablet by mouth every 6 (six) hours as needed. 120 tablet 0   scopolamine (TRANSDERM-SCOP) 1 MG/3DAYS Place 1 patch (1.5 mg total) onto the skin every 3 (three) days. 4 patch 0   Semaglutide,0.25 or 0.5MG /DOS, (OZEMPIC, 0.25 OR 0.5 MG/DOSE,) 2 MG/3ML SOPN Inject 0.5 mg into the skin once a week. 1.5 mL 1   SYRINGE-NEEDLE, DISP, 3 ML (LUER LOCK SAFETY SYRINGES) 25G X 1" 3 ML MISC 1 each by Does not apply route every 7 (seven) days. 8 each 0   tiZANidine (ZANAFLEX) 4 MG tablet Take 4-8 mg by mouth at  bedtime.     citalopram (CELEXA) 20 MG tablet Take 1 tablet (20 mg total) by mouth daily. 30 tablet 3   ergocalciferol (VITAMIN D2) 1.25 MG (50000 UT) capsule Take 50,000 Units by mouth once a week.     metoprolol succinate (TOPROL-XL) 25 MG 24 hr tablet Take 2 tablets (50 mg total)  by mouth daily.     Cholecalciferol 50000 units TABS Take by mouth. (Patient not taking: Reported on 04/17/2023)     No facility-administered medications prior to visit.    Allergies  Allergen Reactions   Lyrica [Pregabalin]     nausea    ROS    See HPI Objective:    Physical Exam Constitutional:      General: She is not in acute distress.    Appearance: Normal appearance. She is well-developed.  HENT:     Head: Normocephalic and atraumatic.     Right Ear: External ear normal.     Left Ear: External ear normal.  Eyes:     General: No scleral icterus. Neck:     Thyroid: No thyromegaly.  Cardiovascular:     Rate and Rhythm: Normal rate and regular rhythm.     Heart sounds: Normal heart sounds. No murmur heard. Pulmonary:     Effort: Pulmonary effort is normal. No respiratory distress.     Breath sounds: Normal breath sounds. No wheezing.  Musculoskeletal:     Cervical back: Neck supple.  Skin:    General: Skin is warm and dry.  Neurological:     Mental Status: She is alert and oriented to person, place, and time.  Psychiatric:        Mood and Affect: Mood normal.        Behavior: Behavior normal.        Thought Content: Thought content normal.        Judgment: Judgment normal.      BP 103/65 (BP Location: Right Arm, Patient Position: Sitting, Cuff Size: Small)   Pulse 86   Temp 98.9 F (37.2 C) (Oral)   Resp 16   Ht 5\' 6"  (1.676 m)   Wt 180 lb (81.6 kg)   SpO2 99%   BMI 29.05 kg/m  Wt Readings from Last 3 Encounters:  04/17/23 180 lb (81.6 kg)  01/19/23 190 lb (86.2 kg)  01/13/23 192 lb (87.1 kg)       Assessment & Plan:   Problem List Items Addressed This Visit        Unprioritized   Vitamin D deficiency    Patient has been on 50,000 units weekly for several months and ran out last month. -Check current Vitamin D level. -Depending on results, consider adjusting dosage or switching to daily over-the-counter dose.      Overweight    Patient has lost 12 pounds, likely due to Ozempic. -Continue Ozempic at current dose. -Consider increasing dose if patient reports increased appetite or cessation of weight loss.      Hypertension   BP is stable.  Continue toprol xl 25mg  once daily for BP and Cardiac reasons.      Relevant Medications   metoprolol succinate (TOPROL-XL) 25 MG 24 hr tablet   Hyperlipidemia   Relevant Medications   metoprolol succinate (TOPROL-XL) 25 MG 24 hr tablet   Hot flashes    Patient reports intermittent hot flashes. No longer taking citalopram.  -Consider resuming Citalopram if symptoms persist.      Relevant Medications   metoprolol succinate (TOPROL-XL) 25 MG 24 hr tablet   Chronic constipation    Patient reports difficulty with bowel movements, possibly related to Ozempic. -Continue Linzess as needed.      B12 deficiency - Primary    Patient reports not having received B12 injection for approximately two months. -Administer B12 injection today.      Relevant Orders  B12 (Completed)   Other Visit Diagnoses       Hyperglycemia       Relevant Orders   Comp Met (CMET) (Completed)   HgB A1c (Completed)   Vitamin D (25 hydroxy) (Completed)      General Health Maintenance / Followup Plans -Administer monthly B12 injections. -Schedule follow-up visit in three months for weight management. -Refer to cardiology for annual check-up. -Refill Diclofenac gel prescription.  I have discontinued Baruch Goldmann Cholecalciferol, ergocalciferol, and citalopram. I have also changed her metoprolol succinate. Additionally, I am having her start on diclofenac Sodium and linaclotide. Lastly, I am having her  maintain her diclofenac sodium, cetirizine, tiZANidine, aspirin EC, HYDROcodone-acetaminophen, scopolamine, atorvastatin, cyanocobalamin, Luer Lock Safety Syringes, and Ozempic (0.25 or 0.5 MG/DOSE). We administered cyanocobalamin.  Meds ordered this encounter  Medications   metoprolol succinate (TOPROL-XL) 25 MG 24 hr tablet    Sig: Take 1 tablet (25 mg total) by mouth daily.    Dispense:  90 tablet    Refill:  1    Supervising Provider:   Danise Edge A [[id number]]   diclofenac Sodium (VOLTAREN) 1 % GEL    Sig: Apply 2 g topically 4 (four) times daily as needed.    Dispense:  100 g    Refill:  5    Supervising Provider:   Danise Edge A [[id number]]   cyanocobalamin (VITAMIN B12) injection 1,000 mcg   linaclotide (LINZESS) 290 MCG CAPS capsule    Sig: Take 1 capsule (290 mcg total) by mouth daily as needed.    Supervising Provider:   Danise Edge A [phone]

## 2023-04-18 ENCOUNTER — Encounter: Payer: Self-pay | Admitting: Family

## 2023-04-18 LAB — COMPREHENSIVE METABOLIC PANEL
AG Ratio: 1.6 (calc) (ref 1.0–2.5)
ALT: 10 U/L (ref 6–29)
AST: 16 U/L (ref 10–35)
Albumin: 3.9 g/dL (ref 3.6–5.1)
Alkaline phosphatase (APISO): 154 U/L — ABNORMAL HIGH (ref 37–153)
BUN: 12 mg/dL (ref 7–25)
CO2: 28 mmol/L (ref 20–32)
Calcium: 9.4 mg/dL (ref 8.6–10.4)
Chloride: 106 mmol/L (ref 98–110)
Creat: 0.78 mg/dL (ref 0.50–1.03)
Globulin: 2.5 g/dL (ref 1.9–3.7)
Glucose, Bld: 65 mg/dL (ref 65–99)
Potassium: 3.4 mmol/L — ABNORMAL LOW (ref 3.5–5.3)
Sodium: 141 mmol/L (ref 135–146)
Total Bilirubin: 0.4 mg/dL (ref 0.2–1.2)
Total Protein: 6.4 g/dL (ref 6.1–8.1)

## 2023-04-18 LAB — VITAMIN D 25 HYDROXY (VIT D DEFICIENCY, FRACTURES): Vit D, 25-Hydroxy: 83 ng/mL (ref 30–100)

## 2023-04-18 LAB — HEMOGLOBIN A1C
Hgb A1c MFr Bld: 5.6 %{Hb} (ref <5.7)
Mean Plasma Glucose: 114 mg/dL
eAG (mmol/L): 6.3 mmol/L

## 2023-04-18 LAB — VITAMIN B12: Vitamin B-12: 234 pg/mL (ref 200–1100)

## 2023-04-19 ENCOUNTER — Other Ambulatory Visit: Payer: Self-pay | Admitting: Family

## 2023-04-19 MED ORDER — LINACLOTIDE 290 MCG PO CAPS: 290.0000 ug | ORAL_CAPSULE | Freq: Every day | ORAL | Status: AC | PRN

## 2023-04-19 NOTE — Assessment & Plan Note (Signed)
  Patient reports difficulty with bowel movements, possibly related to Ozempic. -Continue Linzess as needed.

## 2023-04-19 NOTE — Assessment & Plan Note (Signed)
  Patient has lost 12 pounds, likely due to Ozempic. -Continue Ozempic at current dose. -Consider increasing dose if patient reports increased appetite or cessation of weight loss.

## 2023-04-19 NOTE — Assessment & Plan Note (Signed)
  Patient reports not having received B12 injection for approximately two months. -Administer B12 injection today.

## 2023-04-19 NOTE — Assessment & Plan Note (Signed)
  Patient reports intermittent hot flashes. No longer taking citalopram.  -Consider resuming Citalopram if symptoms persist.

## 2023-04-19 NOTE — Patient Instructions (Signed)
VISIT SUMMARY:  During your routine check-up, we discussed your ongoing health management, including your vitamin deficiencies, weight loss, and other symptoms. You have been doing well with your current regimen, but we made some adjustments to ensure you continue to feel your best.  YOUR PLAN:  -VITAMIN B12 DEFICIENCY: Vitamin B12 is essential for nerve function and blood cell production. Since you haven't received your B12 injection for two months, we administered one today and will continue with monthly injections.  -VITAMIN D DEFICIENCY: Vitamin D is important for bone health and immune function. We will check your current Vitamin D level and may adjust your dosage or switch to a daily over-the-counter dose based on the results.  -WEIGHT MANAGEMENT: You have lost 12 pounds, likely due to Ozempic, which helps with weight loss. Continue taking Ozempic at the current dose. If you notice an increase in appetite or a stop in weight loss, we may consider increasing the dose.  -CONSTIPATION: Constipation can be a side effect of Ozempic. Continue using Linzess as needed to manage this symptom.  -HYPERTENSION: Hypertension, or high blood pressure, is being managed with Metoprolol. Continue taking Metoprolol 25mg  daily, and we have refilled your prescription.  -HOT FLASHES: Hot flashes are sudden feelings of warmth, often related to hormonal changes. If these continue, we may consider resuming Citalopram to help manage the symptoms.  -GENERAL HEALTH MAINTENANCE: We will continue with monthly B12 injections, and you should schedule a follow-up visit in three months for weight management. Additionally, we will refer you to cardiology for your annual check-up and have refilled your Diclofenac gel prescription.  INSTRUCTIONS:  Please schedule a follow-up visit in three months for weight management. Additionally, we will refer you to cardiology for your annual check-up. Your Diclofenac gel prescription  has been refilled.

## 2023-04-19 NOTE — Assessment & Plan Note (Signed)
  Patient has been on 50,000 units weekly for several months and ran out last month. -Check current Vitamin D level. -Depending on results, consider adjusting dosage or switching to daily over-the-counter dose.

## 2023-04-19 NOTE — Assessment & Plan Note (Signed)
BP is stable.  Continue toprol xl 25mg  once daily for BP and Cardiac reasons.

## 2023-05-27 ENCOUNTER — Other Ambulatory Visit: Payer: Self-pay | Admitting: Family

## 2023-06-12 ENCOUNTER — Encounter: Payer: Self-pay | Admitting: Family

## 2023-06-15 ENCOUNTER — Ambulatory Visit (INDEPENDENT_AMBULATORY_CARE_PROVIDER_SITE_OTHER): Payer: 59 | Admitting: Family

## 2023-06-15 VITALS — BP 114/78 | HR 84 | Temp 99.0°F | Resp 16 | Ht 66.0 in | Wt 172.0 lb

## 2023-06-15 DIAGNOSIS — E538 Deficiency of other specified B group vitamins: Secondary | ICD-10-CM

## 2023-06-15 DIAGNOSIS — I10 Essential (primary) hypertension: Secondary | ICD-10-CM

## 2023-06-15 DIAGNOSIS — E663 Overweight: Secondary | ICD-10-CM | POA: Diagnosis not present

## 2023-06-15 DIAGNOSIS — I2510 Atherosclerotic heart disease of native coronary artery without angina pectoris: Secondary | ICD-10-CM | POA: Diagnosis not present

## 2023-06-15 DIAGNOSIS — E785 Hyperlipidemia, unspecified: Secondary | ICD-10-CM | POA: Diagnosis not present

## 2023-06-15 MED ORDER — AMLODIPINE BESYLATE 5 MG PO TABS: 5.0000 mg | ORAL_TABLET | Freq: Every day | ORAL | 1 refills | Status: DC

## 2023-06-15 MED ORDER — METOPROLOL SUCCINATE ER 25 MG PO TB24
25.0000 mg | ORAL_TABLET | Freq: Every day | ORAL | 1 refills | Status: AC
Start: 2023-06-15 — End: ?

## 2023-06-15 MED ORDER — OZEMPIC (1 MG/DOSE) 4 MG/3ML ~~LOC~~ SOPN
1.0000 mg | PEN_INJECTOR | SUBCUTANEOUS | 2 refills | Status: DC
Start: 2023-06-15 — End: 2023-09-01

## 2023-06-15 MED ORDER — ROSUVASTATIN CALCIUM 10 MG PO TABS: 10.0000 mg | ORAL_TABLET | Freq: Every day | ORAL | 1 refills | Status: DC

## 2023-06-15 MED ORDER — CYANOCOBALAMIN 1000 MCG/ML IJ SOLN
1000.0000 ug | Freq: Once | INTRAMUSCULAR | Status: AC
  Administered 2023-06-15: 1000 ug via INTRAMUSCULAR

## 2023-06-15 NOTE — Addendum Note (Signed)
 Addended by: Joye Nobles on: 06/15/2023 12:34 PM   Modules accepted: Orders

## 2023-06-15 NOTE — Assessment & Plan Note (Signed)
  Patient has a history of MI and is on Metoprolol  and Aspirin.  No recent cardiology follow-up noted.  -Place referral for cardiology follow-up. She wishes to establish with cardiology upstairs -Refill Metoprolol  and Aspirin prescriptions  -re-trial statin (crestor ) for CV risk reduction.

## 2023-06-15 NOTE — Assessment & Plan Note (Signed)
  Patient due for B12 injection. -Administer B12 injection today.

## 2023-06-15 NOTE — Progress Notes (Signed)
 0  Subjective:     Patient ID: [name] Steele, female    DOB: 1965/12/03, 58 y.o.   MRN: [id number]  Chief Complaint  Patient presents with   Obesity    Here for talk about her Ozempic  prescription   B12 deficiency    HPI  Discussed the use of AI scribe software for clinical note transcription with the patient, who gave verbal consent to proceed.  History of Present Illness   [name] Steele is a 58 year old female who presents for follow-up on weight management.  She has been on Ozempic  for three months, resulting in an 18-pound weight loss from 190 pounds in September to 172 pounds today. She experiences occasional constipation, which she manages by eating before taking the medication. She reports feeling better overall, with improvements in daily activities such as getting out of the bathtub, but notes that her weight loss has plateaued. Denies significant side effects from ozempic .   She experienced an MI back in 2023 and has been on aspirin, metoprolol , and amlodipine  since then. She discontinued atorvastatin  due to side effects, including nausea and mood changes. She is unsure about the necessity of continuing these medications as her cardiology follow-up indicated stability. Her last cardiology visit was in August 2023.  She has a history of digestive system problems, characterized by severe monthly vomiting and stomach pain, but these symptoms have not recurred recently. These issues were present before starting Ozempic .  Her B12 levels were on the low end of normal, vitamin D  and A1c were normal, kidney function was normal, and potassium was slightly low. She is not currently taking any cholesterol medication after stopping atorvastatin .        Health Maintenance Due  Topic Date Due   Pneumococcal Vaccine 58-63 Years old (1 of 2 - PCV) Never done   Cervical Cancer Screening (HPV/Pap Cotest)  Never done   Colonoscopy  Never done   MAMMOGRAM  Never done   Zoster  Vaccines- Shingrix (1 of 2) Never done   DTaP/Tdap/Td (2 - Td or Tdap) 07/11/2021   COVID-19 Vaccine (3 - 2024-25 season) 01/04/2023    Past Medical History:  Diagnosis Date   Chronic low back pain    Hypertension    Myocardial infarction (HCC) 2023   Sleep apnea     Past Surgical History:  Procedure Laterality Date   ABDOMINAL HYSTERECTOMY     BREAST REDUCTION SURGERY     FOOT SURGERY     GASTRIC BYPASS     TUBAL LIGATION      Family History  Problem Relation Age of Onset   COPD Mother    Throat cancer Mother    Diabetes Father    Kidney disease Father    Breast cancer Sister     Social History   Socioeconomic History   Marital status: Single    Spouse name: Not on file   Number of children: Not on file   Years of education: Not on file   Highest education level: Not on file  Occupational History   Not on file  Tobacco Use   Smoking status: Never    Passive exposure: Never   Smokeless tobacco: Never  Vaping Use   Vaping status: Former  Substance and Sexual Activity   Alcohol use: No   Drug use: No   Sexual activity: Yes    Birth control/protection: Surgical  Other Topics Concern   Not on file  Social History Narrative   3  sons (one set of twins), grown   10 grandchildren (family is local)   Lives with father or her children   Raising her great nephew (this is a stressor)   Works in Therapist, occupational,  runs a venue for parties   Completed some college   Enjoys cooking   No pets    Social Drivers of Corporate investment banker Strain: Medium Risk (12/31/2022)   Overall Financial Resource Strain (CARDIA)    Difficulty of Paying Living Expenses: Somewhat hard  Food Insecurity: No Food Insecurity (12/31/2022)   Hunger Vital Sign    Worried About Running Out of Food in the Last Year: Never true    Ran Out of Food in the Last Year: Never true  Transportation Needs: No Transportation Needs (12/31/2022)   PRAPARE - Administrator, Civil Service  (Medical): No    Lack of Transportation (Non-Medical): No  Physical Activity: Inactive (12/31/2022)   Exercise Vital Sign    Days of Exercise per Week: 0 days    Minutes of Exercise per Session: 0 min  Stress: Stress Concern Present (12/31/2022)   [employer] of Occupational Health - Occupational Stress Questionnaire    Feeling of Stress : Very much  Social Connections: Moderately Isolated (12/31/2022)   Social Connection and Isolation Panel [NHANES]    Frequency of Communication with Friends and Family: More than three times a week    Frequency of Social Gatherings with Friends and Family: Once a week    Attends Religious Services: More than 4 times per year    Active Member of [employer] or Organizations: No    Attends Banker Meetings: Never    Marital Status: Never married  Intimate Partner Violence: Not At Risk (12/31/2022)   Humiliation, Afraid, Rape, and Kick questionnaire    Fear of Current or Ex-Partner: No    Emotionally Abused: No    Physically Abused: No    Sexually Abused: No    Outpatient Medications Prior to Visit  Medication Sig Dispense Refill   aspirin EC 325 MG tablet Take 325 mg by mouth daily.     cetirizine (ZYRTEC) 10 MG tablet TK 1 T PO QD  0   cyanocobalamin  (VITAMIN B12) 1000 MCG/ML injection Inject 1 mL (1,000 mcg total) into the muscle every 7 (seven) days. 4 mL 1   diclofenac  Sodium (VOLTAREN ) 1 % GEL Apply 2 g topically 4 (four) times daily as needed. 100 g 5   HYDROcodone -acetaminophen  (NORCO) 10-325 MG tablet Take 1 tablet by mouth every 6 (six) hours as needed. 120 tablet 0   linaclotide  (LINZESS ) 290 MCG CAPS capsule Take 1 capsule (290 mcg total) by mouth daily as needed.     scopolamine  (TRANSDERM-SCOP) 1 MG/3DAYS Place 1 patch (1.5 mg total) onto the skin every 3 (three) days. 4 patch 0   SYRINGE-NEEDLE, DISP, 3 ML (LUER LOCK SAFETY SYRINGES) 25G X 1" 3 ML MISC 1 each by Does not apply route every 7 (seven) days. 8 each 0   tiZANidine  (ZANAFLEX) 4 MG tablet Take 4-8 mg by mouth at bedtime.     atorvastatin  (LIPITOR) 40 MG tablet Take 1 tablet (40 mg total) by mouth daily. 90 tablet 1   diclofenac  sodium (VOLTAREN ) 1 % GEL Apply topically 4 (four) times daily.     metoprolol  succinate (TOPROL -XL) 25 MG 24 hr tablet Take 1 tablet (25 mg total) by mouth daily. 90 tablet 1   Semaglutide ,0.25 or 0.5MG /DOS, (OZEMPIC , 0.25 OR  0.5 MG/DOSE,) 2 MG/3ML SOPN Inject 0.5 mg into the skin once a week. 3 mL 3   No facility-administered medications prior to visit.    Allergies  Allergen Reactions   Lyrica  [Pregabalin ]     nausea    ROS    See HPI Objective:    Physical Exam Constitutional:      General: She is not in acute distress.    Appearance: Normal appearance. She is well-developed.  HENT:     Head: Normocephalic and atraumatic.     Right Ear: External ear normal.     Left Ear: External ear normal.  Eyes:     General: No scleral icterus. Neck:     Thyroid : No thyromegaly.  Cardiovascular:     Rate and Rhythm: Normal rate and regular rhythm.     Heart sounds: Normal heart sounds. No murmur heard. Pulmonary:     Effort: Pulmonary effort is normal. No respiratory distress.     Breath sounds: Normal breath sounds. No wheezing.  Musculoskeletal:     Cervical back: Neck supple.  Skin:    General: Skin is warm and dry.  Neurological:     Mental Status: She is alert and oriented to person, place, and time.  Psychiatric:        Mood and Affect: Mood normal.        Behavior: Behavior normal.        Thought Content: Thought content normal.        Judgment: Judgment normal.      BP 114/78 (BP Location: Right Arm, Patient Position: Sitting, Cuff Size: Small)   Pulse 84   Temp 99 F (37.2 C) (Oral)   Resp 16   Ht 5\' 6"  (1.676 m)   Wt 172 lb (78 kg)   SpO2 100%   BMI 27.76 kg/m  Wt Readings from Last 3 Encounters:  06/15/23 172 lb (78 kg)  04/17/23 180 lb (81.6 kg)  01/19/23 190 lb (86.2 kg)         Assessment & Plan:   Problem List Items Addressed This Visit       Unprioritized   Overweight    Significant weight loss (18 lbs) over the past 3 months on Ozempic  0.5mg . No significant side effects reported. Patient feels weight loss has plateaued. -Increase Ozempic  to 1mg . -Continue monitoring weight and side effects.        Relevant Medications   Semaglutide , 1 MG/DOSE, (OZEMPIC , 1 MG/DOSE,) 4 MG/3ML SOPN   Hypertension    Patient currently on Amlodipine  5mg  and metoprolol  xl 25mg  with good blood pressure control. -Continue Amlodipine  5mg . -Refill prescription.       Relevant Medications   rosuvastatin  (CRESTOR ) 10 MG tablet   amLODipine  (NORVASC ) 5 MG tablet   metoprolol  succinate (TOPROL -XL) 25 MG 24 hr tablet   Hyperlipidemia - Primary    Patient previously on Atorvastatin  but discontinued due to side effects (nausea, mood changes). No recent cholesterol check on file. -Start Rosuvastatin  (Crestor ) and monitor for tolerance. -Plan to check cholesterol at next visit.       Relevant Medications   rosuvastatin  (CRESTOR ) 10 MG tablet   amLODipine  (NORVASC ) 5 MG tablet   metoprolol  succinate (TOPROL -XL) 25 MG 24 hr tablet   Coronary artery disease involving native heart without angina pectoris    Patient has a history of MI and is on Metoprolol  and Aspirin.  No recent cardiology follow-up noted.  -Place referral for cardiology follow-up. She wishes to establish with cardiology  upstairs -Refill Metoprolol  and Aspirin prescriptions  -re-trial statin (crestor ) for CV risk reduction.      Relevant Medications   rosuvastatin  (CRESTOR ) 10 MG tablet   amLODipine  (NORVASC ) 5 MG tablet   metoprolol  succinate (TOPROL -XL) 25 MG 24 hr tablet   Other Relevant Orders   Ambulatory referral to Cardiology   B12 deficiency    Patient due for B12 injection. -Administer B12 injection today.        I have discontinued [place] atorvastatin  and Ozempic  (0.25 or 0.5  MG/DOSE). I am also having her start on Ozempic  (1 MG/DOSE), rosuvastatin , and amLODipine . Additionally, I am having her maintain her cetirizine, tiZANidine, aspirin EC, HYDROcodone -acetaminophen , scopolamine , cyanocobalamin , Luer Lock Safety Syringes, diclofenac  Sodium, linaclotide , and metoprolol  succinate.  Meds ordered this encounter  Medications   Semaglutide , 1 MG/DOSE, (OZEMPIC , 1 MG/DOSE,) 4 MG/3ML SOPN    Sig: Inject 1 mg into the skin once a week.    Dispense:  3 mL    Refill:  2    Supervising Provider:   Randie Bustle A [[id number]]   rosuvastatin  (CRESTOR ) 10 MG tablet    Sig: Take 1 tablet (10 mg total) by mouth daily.    Dispense:  90 tablet    Refill:  1    Supervising Provider:   Randie Bustle A [[id number]]   amLODipine  (NORVASC ) 5 MG tablet    Sig: Take 1 tablet (5 mg total) by mouth daily.    Dispense:  90 tablet    Refill:  1    Supervising Provider:   Randie Bustle A [[id number]]   metoprolol  succinate (TOPROL -XL) 25 MG 24 hr tablet    Sig: Take 1 tablet (25 mg total) by mouth daily.    Dispense:  90 tablet    Refill:  1    Supervising Provider:   Randie Bustle A [[id number]]

## 2023-06-15 NOTE — Assessment & Plan Note (Signed)
  Patient currently on Amlodipine  5mg  and metoprolol  xl 25mg  with good blood pressure control. -Continue Amlodipine  5mg . -Refill prescription.

## 2023-06-15 NOTE — Assessment & Plan Note (Signed)
  Significant weight loss (18 lbs) over the past 3 months on Ozempic  0.5mg . No significant side effects reported. Patient feels weight loss has plateaued. -Increase Ozempic  to 1mg . -Continue monitoring weight and side effects.

## 2023-06-15 NOTE — Assessment & Plan Note (Signed)
  Patient previously on Atorvastatin  but discontinued due to side effects (nausea, mood changes). No recent cholesterol check on file. -Start Rosuvastatin  (Crestor ) and monitor for tolerance. -Plan to check cholesterol at next visit.

## 2023-07-29 ENCOUNTER — Ambulatory Visit (INDEPENDENT_AMBULATORY_CARE_PROVIDER_SITE_OTHER): Admitting: Family

## 2023-07-29 VITALS — BP 110/76 | HR 78 | Temp 99.1°F | Resp 16 | Ht 66.0 in | Wt 167.0 lb

## 2023-07-29 DIAGNOSIS — E538 Deficiency of other specified B group vitamins: Secondary | ICD-10-CM | POA: Diagnosis not present

## 2023-07-29 DIAGNOSIS — R400 Somnolence: Secondary | ICD-10-CM | POA: Diagnosis not present

## 2023-07-29 DIAGNOSIS — R5383 Other fatigue: Secondary | ICD-10-CM | POA: Diagnosis not present

## 2023-07-29 DIAGNOSIS — T753XXA Motion sickness, initial encounter: Secondary | ICD-10-CM | POA: Diagnosis not present

## 2023-07-29 DIAGNOSIS — Z1211 Encounter for screening for malignant neoplasm of colon: Secondary | ICD-10-CM

## 2023-07-29 DIAGNOSIS — Z1231 Encounter for screening mammogram for malignant neoplasm of breast: Secondary | ICD-10-CM

## 2023-07-29 MED ORDER — SCOPOLAMINE 1 MG/3DAYS TD PT72: 1.0000 | MEDICATED_PATCH | TRANSDERMAL | 0 refills | Status: DC

## 2023-07-29 MED ORDER — CYANOCOBALAMIN 1000 MCG/ML IJ SOLN
1000.0000 ug | Freq: Once | INTRAMUSCULAR | Status: AC
  Administered 2023-07-29: 1000 ug via INTRAMUSCULAR

## 2023-07-29 NOTE — Progress Notes (Unsigned)
 Subjective:     Patient ID: [name] Steele, female    DOB: 02/01/1966, 58 y.o.   MRN: [id number]  Chief Complaint  Patient presents with   Fatigue    Patient complains of feeling and sleepy during the day. This started over a month ago.    B12 deficiency    Last B 12 shot 06/15/23    HPI  Discussed the use of AI scribe software for clinical note transcription with the patient, who gave verbal consent to proceed.  History of Present Illness   [name] Steele is a 58 year old female who presents with excessive daytime sleepiness.  She experiences excessive daytime sleepiness, particularly while driving, which has become a significant concern. She describes episodes of dozing off at the wheel, including a specific incident while driving from [place] where she had to pull over due to overwhelming sleepiness. Despite not feeling tired initially, she fears for her safety while driving.  No significant snoring is reported, and no one has mentioned it to her. Despite being in bed for several hours, she does not feel rested and wakes up every three to four hours, often to urinate. This disrupted sleep pattern contributes to her daytime fatigue.  She has been taking hydrocodone for about fifteen years, which may contribute to her drowsiness. Her current muscle relaxers, believed to be tizanidine, are not as effective as her previous medication in aiding sleep.  She is currently taking vitamin D 50 mg regularly and has recently started Ozempic, resulting in a weight loss of thirteen pounds since December. She reports no side effects from Ozempic and has not yet received her B12 shots.    Health Maintenance Due  Topic Date Due   Pneumococcal Vaccine 20-52 Years old (1 of 2 - PCV) Never done   Zoster Vaccines- Shingrix (1 of 2) Never done   Cervical Cancer Screening (HPV/Pap Cotest)  Never done   Colonoscopy  Never done   MAMMOGRAM  Never done   COVID-19 Vaccine (3 - Pfizer risk  series) 09/13/2019   DTaP/Tdap/Td (2 - Td or Tdap) 07/11/2021    Past Medical History:  Diagnosis Date   Chronic low back pain    Hypertension    Myocardial infarction (HCC) 2023   Sleep apnea     Past Surgical History:  Procedure Laterality Date   ABDOMINAL HYSTERECTOMY     BREAST REDUCTION SURGERY     FOOT SURGERY     GASTRIC BYPASS     TUBAL LIGATION      Family History  Problem Relation Age of Onset   COPD Mother    Throat cancer Mother    Diabetes Father    Kidney disease Father    Breast cancer Sister     Social History   Socioeconomic History   Marital status: Single    Spouse name: Not on file   Number of children: Not on file   Years of education: Not on file   Highest education level: Not on file  Occupational History   Not on file  Tobacco Use   Smoking status: Never    Passive exposure: Never   Smokeless tobacco: Never  Vaping Use   Vaping status: Former  Substance and Sexual Activity   Alcohol use: No   Drug use: No   Sexual activity: Yes    Birth control/protection: Surgical  Other Topics Concern   Not on file  Social History Narrative   3 sons (one set of  twins), grown   10 grandchildren (family is local)   Lives with father or her children   Raising her great nephew (this is a stressor)   Works in Therapist, occupational,  runs a venue for parties   Completed some college   Enjoys cooking   No pets    Social Drivers of Corporate investment banker Strain: Medium Risk (12/31/2022)   Overall Financial Resource Strain (CARDIA)    Difficulty of Paying Living Expenses: Somewhat hard  Food Insecurity: No Food Insecurity (12/31/2022)   Hunger Vital Sign    Worried About Running Out of Food in the Last Year: Never true    Ran Out of Food in the Last Year: Never true  Transportation Needs: No Transportation Needs (12/31/2022)   PRAPARE - Administrator, Civil Service (Medical): No    Lack of Transportation (Non-Medical): No  Physical  Activity: Inactive (12/31/2022)   Exercise Vital Sign    Days of Exercise per Week: 0 days    Minutes of Exercise per Session: 0 min  Stress: Stress Concern Present (12/31/2022)   [employer] of Occupational Health - Occupational Stress Questionnaire    Feeling of Stress : Very much  Social Connections: Moderately Isolated (12/31/2022)   Social Connection and Isolation Panel [NHANES]    Frequency of Communication with Friends and Family: More than three times a week    Frequency of Social Gatherings with Friends and Family: Once a week    Attends Religious Services: More than 4 times per year    Active Member of [employer] or Organizations: No    Attends Banker Meetings: Never    Marital Status: Never married  Intimate Partner Violence: Not At Risk (12/31/2022)   Humiliation, Afraid, Rape, and Kick questionnaire    Fear of Current or Ex-Partner: No    Emotionally Abused: No    Physically Abused: No    Sexually Abused: No    Outpatient Medications Prior to Visit  Medication Sig Dispense Refill   amLODipine (NORVASC) 5 MG tablet Take 1 tablet (5 mg total) by mouth daily. 90 tablet 1   aspirin EC 325 MG tablet Take 325 mg by mouth daily.     cetirizine (ZYRTEC) 10 MG tablet TK 1 T PO QD  0   cyanocobalamin (VITAMIN B12) 1000 MCG/ML injection Inject 1 mL (1,000 mcg total) into the muscle every 7 (seven) days. 4 mL 1   diclofenac Sodium (VOLTAREN) 1 % GEL Apply 2 g topically 4 (four) times daily as needed. 100 g 5   HYDROcodone-acetaminophen (NORCO) 10-325 MG tablet Take 1 tablet by mouth every 6 (six) hours as needed. 120 tablet 0   linaclotide (LINZESS) 290 MCG CAPS capsule Take 1 capsule (290 mcg total) by mouth daily as needed.     metoprolol succinate (TOPROL-XL) 25 MG 24 hr tablet Take 1 tablet (25 mg total) by mouth daily. 90 tablet 1   rosuvastatin (CRESTOR) 10 MG tablet Take 1 tablet (10 mg total) by mouth daily. 90 tablet 1   Semaglutide, 1 MG/DOSE, (OZEMPIC, 1  MG/DOSE,) 4 MG/3ML SOPN Inject 1 mg into the skin once a week. 3 mL 2   SYRINGE-NEEDLE, DISP, 3 ML (LUER LOCK SAFETY SYRINGES) 25G X 1" 3 ML MISC 1 each by Does not apply route every 7 (seven) days. 8 each 0   tiZANidine (ZANAFLEX) 4 MG tablet Take 4-8 mg by mouth at bedtime.     scopolamine (TRANSDERM-SCOP) 1 MG/3DAYS Place 1  patch (1.5 mg total) onto the skin every 3 (three) days. 4 patch 0   No facility-administered medications prior to visit.    Allergies  Allergen Reactions   Lyrica [Pregabalin]     nausea    ROS    See HPI  Objective:    Physical Exam Constitutional:      General: She is not in acute distress.    Appearance: Normal appearance. She is well-developed.  HENT:     Head: Normocephalic and atraumatic.     Right Ear: External ear normal.     Left Ear: External ear normal.  Eyes:     General: No scleral icterus. Neck:     Thyroid: No thyromegaly.  Cardiovascular:     Rate and Rhythm: Normal rate and regular rhythm.     Heart sounds: Normal heart sounds. No murmur heard. Pulmonary:     Effort: Pulmonary effort is normal. No respiratory distress.     Breath sounds: Normal breath sounds. No wheezing.  Musculoskeletal:     Cervical back: Neck supple.  Skin:    General: Skin is warm and dry.  Neurological:     Mental Status: She is alert and oriented to person, place, and time.  Psychiatric:        Mood and Affect: Mood normal.        Behavior: Behavior normal.        Thought Content: Thought content normal.        Judgment: Judgment normal.      BP 110/76 (BP Location: Right Arm, Patient Position: Sitting, Cuff Size: Small)   Pulse 78   Temp 99.1 F (37.3 C) (Oral)   Resp 16   Ht 5\' 6"  (1.676 m)   Wt 167 lb (75.8 kg)   SpO2 97%   BMI 26.95 kg/m  Wt Readings from Last 3 Encounters:  07/29/23 167 lb (75.8 kg)  06/15/23 172 lb (78 kg)  04/17/23 180 lb (81.6 kg)       Assessment & Plan:   Problem List Items Addressed This Visit        Unprioritized   Motion sickness   Has upcoming cruise, requesting refill on scopolamine patch.      Relevant Medications   scopolamine (TRANSDERM-SCOP) 1 MG/3DAYS   Daytime somnolence    Experiences excessive daytime sleepiness, especially while driving, with episodes of dozing off during conversations and at stoplights. Differential diagnosis includes sleep apnea, medication side effects, and other sleep disorders. - Order basic labs to rule out anemia and thyroid issues. - Refer to a sleep specialist for a sleep study to evaluate for sleep apnea. - Advise against driving when drowsy.      Relevant Orders   Ambulatory referral to Pulmonology   B12 deficiency    Has not yet received her B12 injections, which are part of her treatment plan. - Administer B12 injection before she leaves.      Other Visit Diagnoses       Fatigue, unspecified type    -  Primary   Relevant Orders   CBC w/Diff   B12   TSH   Comp Met (CMET)     Breast cancer screening by mammogram       Relevant Orders   MM 3D SCREENING MAMMOGRAM BILATERAL BREAST     Screening for colon cancer       Relevant Orders   Ambulatory referral to Gastroenterology      General Health Maintenance Taking vitamin D regularly.  Due for a mammogram and has not had a colonoscopy in years. - Order mammogram and schedule it before she leaves. - Set up a colonoscopy for colon cancer screening.  I am having [name] Steele maintain her cetirizine, tiZANidine, aspirin EC, HYDROcodone-acetaminophen, cyanocobalamin, Luer Lock Safety Syringes, diclofenac Sodium, linaclotide, Ozempic (1 MG/DOSE), rosuvastatin, amLODipine, metoprolol succinate, and scopolamine. We administered cyanocobalamin.  Meds ordered this encounter  Medications   cyanocobalamin (VITAMIN B12) injection 1,000 mcg   scopolamine (TRANSDERM-SCOP) 1 MG/3DAYS    Sig: Place 1 patch (1.5 mg total) onto the skin every 3 (three) days.    Dispense:  4 patch     Refill:  0    Supervising Provider:   Danise Edge A [[id number]]

## 2023-07-29 NOTE — Progress Notes (Unsigned)
   Established Patient Office Visit  Subjective   Patient ID: [name] Steele, female    DOB: [birth year]  Age: 58 y.o. MRN: [id number]  Chief Complaint  Patient presents with   Fatigue    Patient complains of feeling and sleepy during the day. This started over a month ago.    B12 deficiency    Last B 12 shot 06/15/23    Pleasant 58 yo patient presents with complaints of feeling tired and falling asleep while talking to others and while driving. She reports going to bed between 11p-12a nightly and wakes up 4-5 times during the night. States her bed partner has not told her she snores or gasps during the night. Patient states she has a Sleep Number bed and sleeps with the [facility] and feet elevated.   Patient reports she had a sleep study approximately 5 years ago. CPAP was ordered, but patient states she was "non-compliant because that thing blowing in my nose caused me to get a lot of colds." Per patient, therapy was discontinued due to non-compliance.    Review of Systems  Constitutional:  Negative for chills and fever.  HENT: Negative.    Eyes: Negative.   Respiratory: Negative.    Cardiovascular: Negative.   Gastrointestinal: Negative.   Genitourinary: Negative.   Musculoskeletal:  Positive for joint pain.       Complains of left hand pain and stiffness when awakening in the am.   Skin: Negative.   Neurological:        Complains of drowsiness/falling asleep while driving and when interacting with others.   Psychiatric/Behavioral: Negative.       Objective:     BP 110/76 (BP Location: Right Arm, Patient Position: Sitting, Cuff Size: Small)   Pulse 78   Temp 99.1 F (37.3 C) (Oral)   Resp 16   Ht 5\' 6"  (1.676 m)   Wt 167 lb (75.8 kg)   SpO2 97%   BMI 26.95 kg/m  {Vitals History (Optional):23777}  Physical Exam Vitals reviewed.  Constitutional:      Appearance: Normal appearance.  HENT:     Head: Normocephalic and atraumatic.  Eyes:     Pupils: Pupils are equal,  round, and reactive to light.  Cardiovascular:     Rate and Rhythm: Normal rate and regular rhythm.     Pulses: Normal pulses.     Heart sounds: Normal heart sounds.  Pulmonary:     Effort: Pulmonary effort is normal.     Breath sounds: Normal breath sounds.  Skin:    General: Skin is warm and dry.  Neurological:     Mental Status: She is alert and oriented to person, place, and time.  Psychiatric:        Mood and Affect: Mood normal.        Behavior: Behavior normal.        Thought Content: Thought content normal.     {Labs (Optional):23779}    Assessment & Plan:   -Hypertension - stable on amlodipine and metoprolol.  -Sleep apnea - CPAP therapy was discontinued due to non-compliance according to patient. Now experiencing drowsiness that is interfering with QOL.  -Chronic constipation - reports occasional constipation. Continues Linzess prn.   -Hyperlipidemia - reports taking Crestor "occasionally." Encouraged patient to begin taking as prescribed.   -B12 deficiency - draw B12 level today. B12 injection during visit.   Cristopher Peru, RN

## 2023-07-30 ENCOUNTER — Ambulatory Visit (HOSPITAL_BASED_OUTPATIENT_CLINIC_OR_DEPARTMENT_OTHER)
Admission: RE | Admit: 2023-07-30 | Discharge: 2023-07-30 | Disposition: A | Discharge status: Home / Self Care | Source: Ambulatory Visit | Attending: Family | Admitting: Family

## 2023-07-30 DIAGNOSIS — G4719 Other hypersomnia: Secondary | ICD-10-CM | POA: Insufficient documentation

## 2023-07-30 DIAGNOSIS — Z1231 Encounter for screening mammogram for malignant neoplasm of breast: Secondary | ICD-10-CM | POA: Diagnosis present

## 2023-07-30 DIAGNOSIS — R400 Somnolence: Secondary | ICD-10-CM | POA: Insufficient documentation

## 2023-07-30 DIAGNOSIS — T753XXA Motion sickness, initial encounter: Secondary | ICD-10-CM | POA: Insufficient documentation

## 2023-07-30 LAB — CBC WITH DIFFERENTIAL/PLATELET
Basophils Absolute: 0 10*3/uL (ref 0.0–0.1)
Basophils Relative: 0.8 % (ref 0.0–3.0)
Eosinophils Absolute: 0.1 10*3/uL (ref 0.0–0.7)
Eosinophils Relative: 1.6 % (ref 0.0–5.0)
HCT: 35.2 % — ABNORMAL LOW (ref 36.0–46.0)
Hemoglobin: 11.8 g/dL — ABNORMAL LOW (ref 12.0–15.0)
Lymphocytes Relative: 46.3 % — ABNORMAL HIGH (ref 12.0–46.0)
Lymphs Abs: 1.9 10*3/uL (ref 0.7–4.0)
MCHC: 33.6 g/dL (ref 30.0–36.0)
MCV: 94.8 fl (ref 78.0–100.0)
Monocytes Absolute: 0.2 10*3/uL (ref 0.1–1.0)
Monocytes Relative: 5.6 % (ref 3.0–12.0)
Neutro Abs: 1.9 10*3/uL (ref 1.4–7.7)
Neutrophils Relative %: 45.7 % (ref 43.0–77.0)
Platelets: 281 10*3/uL (ref 150.0–400.0)
RBC: 3.72 Mil/uL — ABNORMAL LOW (ref 3.87–5.11)
RDW: 13 % (ref 11.5–15.5)
WBC: 4.1 10*3/uL (ref 4.0–10.5)

## 2023-07-30 LAB — COMPREHENSIVE METABOLIC PANEL WITH GFR
ALT: 12 U/L (ref 0–35)
AST: 18 U/L (ref 0–37)
Albumin: 4 g/dL (ref 3.5–5.2)
Alkaline Phosphatase: 150 U/L — ABNORMAL HIGH (ref 39–117)
BUN: 14 mg/dL (ref 6–23)
CO2: 29 meq/L (ref 19–32)
Calcium: 8.8 mg/dL (ref 8.4–10.5)
Chloride: 104 meq/L (ref 96–112)
Creatinine, Ser: 0.69 mg/dL (ref 0.40–1.20)
GFR: 95.97 mL/min (ref >60.00)
Glucose, Bld: 83 mg/dL (ref 70–99)
Potassium: 3.9 meq/L (ref 3.5–5.1)
Sodium: 139 meq/L (ref 135–145)
Total Bilirubin: 0.4 mg/dL (ref 0.2–1.2)
Total Protein: 6.5 g/dL (ref 6.0–8.3)

## 2023-07-30 NOTE — Assessment & Plan Note (Signed)
  Has not yet received her B12 injections, which are part of her treatment plan. - Administer B12 injection before she leaves.

## 2023-07-30 NOTE — Patient Instructions (Signed)
 VISIT SUMMARY:  Today, we discussed your excessive daytime sleepiness, particularly while driving, and explored potential causes and solutions. We also reviewed your current medications, weight management progress, and vitamin B12 deficiency. Additionally, we addressed general health maintenance, including upcoming screenings and preparations for your cruise.  YOUR PLAN:  -EXCESSIVE DAYTIME SLEEPINESS: Excessive daytime sleepiness means feeling very sleepy during the day, which can be dangerous, especially while driving. We will conduct basic lab tests to check for anemia and thyroid issues and refer you to a sleep specialist for a sleep study to evaluate for sleep apnea. Please avoid driving when you feel drowsy.  -MEDICATION MANAGEMENT: Your current medications include hydrocodone and tizanidine. Hydrocodone may contribute to your drowsiness, and tizanidine is less effective than your previous muscle relaxer. Please discuss these concerns with your prescribing physician.  -OBESITY: You are currently on Ozempic for weight management and have lost 13 pounds since December. Continue taking Ozempic as prescribed, as you are tolerating it well and experiencing no side effects.  -VITAMIN B12 DEFICIENCY: Vitamin B12 deficiency means your body lacks enough vitamin B12, which is important for nerve function and blood cell production. We will administer a B12 injection before you leave today.  -GENERAL HEALTH MAINTENANCE: You are taking vitamin D regularly. You are due for a mammogram and a colonoscopy for routine cancer screenings. We will order a mammogram and schedule it before you leave, and set up a colonoscopy. Additionally, we will prescribe scopolamine patches for your upcoming cruise to help with motion sickness.  INSTRUCTIONS:  Please follow up with the sleep specialist for your sleep study and avoid driving when you feel drowsy. Discuss your muscle relaxer concerns with your prescribing  physician. We will administer your B12 injection and schedule your mammogram and colonoscopy before you leave today. Use the prescribed scopolamine patches as directed for your cruise in May.

## 2023-07-30 NOTE — Assessment & Plan Note (Signed)
 Has upcoming cruise, requesting refill on scopolamine patch.

## 2023-07-30 NOTE — Assessment & Plan Note (Signed)
  Experiences excessive daytime sleepiness, especially while driving, with episodes of dozing off during conversations and at stoplights. Differential diagnosis includes sleep apnea, medication side effects, and other sleep disorders. - Order basic labs to rule out anemia and thyroid issues. - Refer to a sleep specialist for a sleep study to evaluate for sleep apnea. - Advise against driving when drowsy.

## 2023-07-31 ENCOUNTER — Telehealth: Payer: Self-pay | Admitting: Family

## 2023-07-31 ENCOUNTER — Other Ambulatory Visit: Payer: Self-pay

## 2023-07-31 DIAGNOSIS — E538 Deficiency of other specified B group vitamins: Secondary | ICD-10-CM

## 2023-07-31 LAB — VITAMIN B12: Vitamin B-12: 121 pg/mL — ABNORMAL LOW (ref 211–911)

## 2023-07-31 LAB — TSH: TSH: 1.05 u[IU]/mL (ref 0.35–5.50)

## 2023-07-31 MED ORDER — LUER LOCK SAFETY SYRINGES 25G X 1" 3 ML MISC: 1.0000 | 0 refills | Status: AC

## 2023-07-31 MED ORDER — CYANOCOBALAMIN 1000 MCG/ML IJ SOLN: INTRAMUSCULAR | 0 refills | Status: DC

## 2023-07-31 NOTE — Telephone Encounter (Signed)
 B12 level is low. This is likely causing her some anemia and contributing to her fatigue.  I sent a refill for b12 injections to be given once weekly for 4 weeks then once monthly.  Please make sure she know how to self administer.

## 2023-07-31 NOTE — Telephone Encounter (Signed)
Patient notified of results and recommendations.     She verbalized understanding.

## 2023-08-03 NOTE — Telephone Encounter (Signed)
 Opened in error

## 2023-08-12 ENCOUNTER — Ambulatory Visit: Admitting: Family

## 2023-08-14 ENCOUNTER — Ambulatory Visit: Admitting: Family

## 2023-08-14 VITALS — BP 94/63 | HR 83 | Temp 98.8°F | Resp 16 | Ht 66.0 in | Wt 161.0 lb

## 2023-08-14 DIAGNOSIS — B351 Tinea unguium: Secondary | ICD-10-CM

## 2023-08-14 DIAGNOSIS — E538 Deficiency of other specified B group vitamins: Secondary | ICD-10-CM | POA: Diagnosis not present

## 2023-08-14 MED ORDER — CYANOCOBALAMIN 1000 MCG/ML IJ SOLN
1000.0000 ug | Freq: Once | INTRAMUSCULAR | Status: AC
  Administered 2023-08-14: 1000 ug via INTRAMUSCULAR

## 2023-08-14 MED ORDER — TERBINAFINE HCL 250 MG PO TABS: 250.0000 mg | ORAL_TABLET | Freq: Every day | ORAL | 0 refills | Status: DC

## 2023-08-14 NOTE — Progress Notes (Signed)
 Subjective:     Patient ID: [name] Steele, female    DOB: 06/03/1965, 58 y.o.   MRN: [id number]  Chief Complaint  Patient presents with   Toe Pain    Complains of pain on both big toes    Toe Pain     Discussed the use of AI scribe software for clinical note transcription with the patient, who gave verbal consent to proceed.  History of Present Illness  [name] Steele is a 58 year old female who presents with shoulder and foot pain.  She describes a bruising sensation and thickening of the toenail.  Notes pain under the great toenails. Especially after wearing tennis shoes.  She mentions a previous visit for similar symptoms in the past to podiatry in [place]. She is open to visiting a podiatrist in other locations such as [place] or [place].  She recently started taking B12 injection weekly x 4 and then monthly due to low levels and is awaiting her next dose.     Health Maintenance Due  Topic Date Due   Pneumococcal Vaccine 31-17 Years old (1 of 2 - PCV) Never done   Zoster Vaccines- Shingrix (1 of 2) Never done   Cervical Cancer Screening (HPV/Pap Cotest)  Never done   Colonoscopy  Never done   COVID-19 Vaccine (3 - Pfizer risk series) 09/13/2019   DTaP/Tdap/Td (2 - Td or Tdap) 07/11/2021    Past Medical History:  Diagnosis Date   Chronic low back pain    Hypertension    Myocardial infarction (HCC) 2023   Sleep apnea     Past Surgical History:  Procedure Laterality Date   ABDOMINAL HYSTERECTOMY     BREAST REDUCTION SURGERY     FOOT SURGERY     GASTRIC BYPASS     TUBAL LIGATION      Family History  Problem Relation Age of Onset   COPD Mother    Throat cancer Mother    Diabetes Father    Kidney disease Father    Breast cancer Sister     Social History   Socioeconomic History   Marital status: Single    Spouse name: Not on file   Number of children: Not on file   Years of education: Not on file   Highest education level: Not  on file  Occupational History   Not on file  Tobacco Use   Smoking status: Never    Passive exposure: Never   Smokeless tobacco: Never  Vaping Use   Vaping status: Former  Substance and Sexual Activity   Alcohol use: No   Drug use: No   Sexual activity: Yes    Birth control/protection: Surgical  Other Topics Concern   Not on file  Social History Narrative   3 sons (one set of twins), grown   10 grandchildren (family is local)   Lives with father or her children   Raising her great nephew (this is a stressor)   Works in Therapist, occupational,  runs a venue for parties   Completed some college   Enjoys cooking   No pets    Social Drivers of Corporate investment banker Strain: Medium Risk (12/31/2022)   Overall Financial Resource Strain (CARDIA)    Difficulty of Paying Living Expenses: Somewhat hard  Food Insecurity: No Food Insecurity (12/31/2022)   Hunger Vital Sign    Worried About Running Out of Food in the Last Year: Never true    Ran Out of Food in  the Last Year: Never true  Transportation Needs: No Transportation Needs (12/31/2022)   PRAPARE - Administrator, Civil Service (Medical): No    Lack of Transportation (Non-Medical): No  Physical Activity: Inactive (12/31/2022)   Exercise Vital Sign    Days of Exercise per Week: 0 days    Minutes of Exercise per Session: 0 min  Stress: Stress Concern Present (12/31/2022)   [employer] of Occupational Health - Occupational Stress Questionnaire    Feeling of Stress : Very much  Social Connections: Moderately Isolated (12/31/2022)   Social Connection and Isolation Panel [NHANES]    Frequency of Communication with Friends and Family: More than three times a week    Frequency of Social Gatherings with Friends and Family: Once a week    Attends Religious Services: More than 4 times per year    Active Member of [employer] or Organizations: No    Attends Banker Meetings: Never    Marital Status: Never married   Intimate Partner Violence: Not At Risk (12/31/2022)   Humiliation, Afraid, Rape, and Kick questionnaire    Fear of Current or Ex-Partner: No    Emotionally Abused: No    Physically Abused: No    Sexually Abused: No    Outpatient Medications Prior to Visit  Medication Sig Dispense Refill   amLODipine (NORVASC) 5 MG tablet Take 1 tablet (5 mg total) by mouth daily. 90 tablet 1   aspirin EC 325 MG tablet Take 325 mg by mouth daily.     cetirizine (ZYRTEC) 10 MG tablet TK 1 T PO QD  0   cyanocobalamin (VITAMIN B12) 1000 MCG/ML injection Inject 1000 mcg IM weekly for 4 weeks, then once monthly 12 mL 0   diclofenac Sodium (VOLTAREN) 1 % GEL Apply 2 g topically 4 (four) times daily as needed. 100 g 5   HYDROcodone-acetaminophen (NORCO) 10-325 MG tablet Take 1 tablet by mouth every 6 (six) hours as needed. 120 tablet 0   linaclotide (LINZESS) 290 MCG CAPS capsule Take 1 capsule (290 mcg total) by mouth daily as needed.     metoprolol succinate (TOPROL-XL) 25 MG 24 hr tablet Take 1 tablet (25 mg total) by mouth daily. 90 tablet 1   rosuvastatin (CRESTOR) 10 MG tablet Take 1 tablet (10 mg total) by mouth daily. 90 tablet 1   scopolamine (TRANSDERM-SCOP) 1 MG/3DAYS Place 1 patch (1.5 mg total) onto the skin every 3 (three) days. 4 patch 0   Semaglutide, 1 MG/DOSE, (OZEMPIC, 1 MG/DOSE,) 4 MG/3ML SOPN Inject 1 mg into the skin once a week. 3 mL 2   SYRINGE-NEEDLE, DISP, 3 ML (LUER LOCK SAFETY SYRINGES) 25G X 1" 3 ML MISC 1 each by Does not apply route every 7 (seven) days. 8 each 0   tiZANidine (ZANAFLEX) 4 MG tablet Take 4-8 mg by mouth at bedtime.     No facility-administered medications prior to visit.    Allergies  Allergen Reactions   Lyrica [Pregabalin]     nausea    ROS    See HPI Objective:    Physical Exam Constitutional:      Appearance: Normal appearance.  Cardiovascular:     Rate and Rhythm: Normal rate.  Pulmonary:     Effort: Pulmonary effort is normal.  Skin:     General: Skin is warm and dry.     Comments: Hypertrophic great toenails bilaterally, some discoloration noted  Neurological:     Mental Status: She is alert.  Psychiatric:  Mood and Affect: Mood normal.        Behavior: Behavior normal.        Thought Content: Thought content normal.        Judgment: Judgment normal.      BP 94/63 (BP Location: Right Arm, Patient Position: Sitting, Cuff Size: Normal)   Pulse 83   Temp 98.8 F (37.1 C) (Oral)   Resp 16   Ht 5\' 6"  (1.676 m)   Wt 161 lb (73 kg)   SpO2 98%   BMI 25.99 kg/m  Wt Readings from Last 3 Encounters:  08/14/23 161 lb (73 kg)  07/29/23 167 lb (75.8 kg)  06/15/23 172 lb (78 kg)       Assessment & Plan:   Problem List Items Addressed This Visit       Unprioritized   Onychomycosis - Primary    Chronic toenail fungus causing thickening and discomfort. Previous treatment provided temporary relief. Proposed terbinafine treatment with expected improvement in 6-12 months. Monitor liver function due to potential hepatotoxicity. Consider podiatry referral for toenail grinding. - Prescribe terbinafine for 12 weeks. Send a 30-day supply initially. - Schedule liver function tests after 30 days of terbinafine treatment. - Refer to podiatry for nail debridement - Advise filing down toenails as short as possible for temporary relief.      Relevant Medications   terbinafine (LAMISIL) 250 MG tablet   Other Relevant Orders   Ambulatory referral to Podiatry   Hepatic function panel   B12 deficiency   Recently started on b12 shots.  B12 given today.       Relevant Medications   cyanocobalamin (VITAMIN B12) injection 1,000 mcg (Start on 08/14/2023  3:30 PM)    I am having [name] Steele start on terbinafine. I am also having her maintain her cetirizine, tiZANidine, aspirin EC, HYDROcodone-acetaminophen, diclofenac Sodium, linaclotide, Ozempic (1 MG/DOSE), rosuvastatin, amLODipine, metoprolol succinate, scopolamine,  cyanocobalamin, and Luer Lock Safety Syringes. We will continue to administer cyanocobalamin.  Meds ordered this encounter  Medications   terbinafine (LAMISIL) 250 MG tablet    Sig: Take 1 tablet (250 mg total) by mouth daily.    Dispense:  30 tablet    Refill:  0    Supervising Provider:   Danise Edge A [[id number]]   cyanocobalamin (VITAMIN B12) injection 1,000 mcg

## 2023-08-14 NOTE — Assessment & Plan Note (Signed)
 Recently started on b12 shots.  B12 given today.

## 2023-08-14 NOTE — Patient Instructions (Signed)
 VISIT SUMMARY:  Today, you were seen for severe pain in your shoulder and the top of your feet, as well as issues with your toenails. You also discussed your ongoing treatment for low Vitamin B12 levels.  YOUR PLAN:  -ONYCHOMYCOSIS: Onychomycosis is a chronic toenail fungus that causes thickening and discomfort. We will start you on terbinafine for 12 weeks, which should help improve the condition over the next 6-12 months. Please be aware that this medication can affect your liver, so we will schedule liver function tests after 30 days of treatment. Additionally, you will be referred to a podiatrist for potential toenail grinding, and in the meantime, you should file down your toenails as short as possible for temporary relief.  -VITAMIN B12 DEFICIENCY: Vitamin B12 deficiency means your body has low levels of Vitamin B12, which is essential for nerve function and the production of red blood cells. You will continue with your weekly B12 injections to manage this condition. We administered your B12 injection today.  INSTRUCTIONS:  Please schedule liver function tests after 30 days of taking terbinafine. Additionally, follow up with a podiatrist for potential toenail grinding.

## 2023-08-14 NOTE — Assessment & Plan Note (Signed)
  Chronic toenail fungus causing thickening and discomfort. Previous treatment provided temporary relief. Proposed terbinafine treatment with expected improvement in 6-12 months. Monitor liver function due to potential hepatotoxicity. Consider podiatry referral for toenail grinding. - Prescribe terbinafine for 12 weeks. Send a 30-day supply initially. - Schedule liver function tests after 30 days of terbinafine treatment. - Refer to podiatry for nail debridement - Advise filing down toenails as short as possible for temporary relief.

## 2023-08-17 ENCOUNTER — Encounter: Payer: Self-pay | Admitting: Family

## 2023-08-20 ENCOUNTER — Ambulatory Visit (INDEPENDENT_AMBULATORY_CARE_PROVIDER_SITE_OTHER): Admitting: Podiatry

## 2023-08-20 ENCOUNTER — Ambulatory Visit: Admitting: Podiatry

## 2023-08-20 DIAGNOSIS — Z91199 Patient's noncompliance with other medical treatment and regimen due to unspecified reason: Secondary | ICD-10-CM

## 2023-08-20 NOTE — Progress Notes (Signed)
 No show

## 2023-08-27 ENCOUNTER — Other Ambulatory Visit: Payer: Self-pay | Admitting: Family

## 2023-08-27 ENCOUNTER — Telehealth: Payer: Self-pay | Admitting: Family

## 2023-08-27 ENCOUNTER — Ambulatory Visit (INDEPENDENT_AMBULATORY_CARE_PROVIDER_SITE_OTHER): Admitting: Podiatry

## 2023-08-27 ENCOUNTER — Ambulatory Visit: Admitting: Podiatry

## 2023-08-27 ENCOUNTER — Encounter: Payer: Self-pay | Admitting: Podiatry

## 2023-08-27 DIAGNOSIS — T753XXA Motion sickness, initial encounter: Secondary | ICD-10-CM

## 2023-08-27 DIAGNOSIS — G5763 Lesion of plantar nerve, bilateral lower limbs: Secondary | ICD-10-CM

## 2023-08-27 DIAGNOSIS — B351 Tinea unguium: Secondary | ICD-10-CM

## 2023-08-27 DIAGNOSIS — E538 Deficiency of other specified B group vitamins: Secondary | ICD-10-CM

## 2023-08-27 MED ORDER — SCOPOLAMINE 1 MG/3DAYS TD PT72: 1.0000 | MEDICATED_PATCH | TRANSDERMAL | 0 refills | Status: DC

## 2023-08-27 MED ORDER — CYANOCOBALAMIN 1000 MCG/ML IJ SOLN: 1000.0000 ug | INTRAMUSCULAR | 0 refills | Status: DC

## 2023-08-27 NOTE — Addendum Note (Signed)
 Addended by: Ruthe Coward on: 08/27/2023 12:02 PM   Modules accepted: Orders

## 2023-08-27 NOTE — Telephone Encounter (Signed)
 Copied from CRM [id number]. Topic: Clinical - Medication Refill >> Aug 27, 2023 11:37 AM Freya Jesus wrote: Most Recent Primary Care Visit:  Provider: O'SULLIVAN, MELISSA  Department: [facility]  Visit Type: ACUTE  Date: 08/14/2023  Medication: cyanocobalamin  (VITAMIN B12) 1000 MCG/ML injection [[id number]]  Has the patient contacted their pharmacy? No (Agent: If no, request that the patient contact the pharmacy for the refill. If patient does not wish to contact the pharmacy document the reason why and proceed with request.) (Agent: If yes, when and what did the pharmacy advise?)  Is this the correct pharmacy for this prescription? Yes If no, delete pharmacy and type the correct one.  This is the patient's preferred pharmacy:  [id number] - [place], [place] - [address] AT [facility] [address] [place] [address] Phone: [phone] Fax: [phone]    Has the prescription been filled recently? Yes  Is the patient out of the medication? Yes  Has the patient been seen for an appointment in the last year OR does the patient have an upcoming appointment? Yes  Can we respond through MyChart? No  Agent: Please be advised that Rx refills may take up to 3 business days. We ask that you follow-up with your pharmacy.

## 2023-08-27 NOTE — Addendum Note (Signed)
 Addended by: Jamie Mccoy on: 08/27/2023 04:59 PM   Modules accepted: Orders

## 2023-08-27 NOTE — Telephone Encounter (Signed)
 Copied from CRM [id number]. Topic: Clinical - Prescription Issue >> Aug 27, 2023 11:40 AM Freya Jesus wrote: Reason for CRM: Patient stated since last month she was supposed to get patches for her cruise and never got them. Requesting we send it to her [facility] pharmacy.

## 2023-08-27 NOTE — Progress Notes (Signed)
  Subjective:  Patient ID: [name] Steele, female    DOB: 22-Nov-1965,   MRN: [id number]  No chief complaint on file.   58 y.o. female presents for concern of nail changes to bilateral great toenails . Relates the toenails have bee en discolored and painful and starting to cause pain along the ball of her foot. Denies any real treatment options recently. Relates she has been seen before for this and had some powder put on her feet but didn't  do any thing.  Denies any other pedal complaints. Denies n/v/f/c.   Past Medical History:  Diagnosis Date   Chronic low back pain    Hypertension    Myocardial infarction (HCC) 2023   Sleep apnea     Objective:  Physical Exam: Vascular: DP/PT pulses 2/4 bilateral. CFT <3 seconds. Normal hair growth on digits. No edema.  Skin. No lacerations or abrasions bilateral feet. Bilateral hallux nails thickened and dystrophic and noted color changes despite gel nail polish overlying.  Musculoskeletal: MMT 5/5 bilateral lower extremities in DF, PF, Inversion and Eversion. Deceased ROM in DF of ankle joint. Mildly tender to ball of foot bilateral.  Neurological: Sensation intact to light touch.   Assessment:   1. Onychomycosis   2. Morton metatarsalgia, bilateral      Plan:  Patient was evaluated and treated and all questions answered. -Examined patient -Discussed treatment options for painful dystrophic nails  -Clinical picture and Fungal culture was obtained by removing a portion of the hard nail itself from each of the involved toenails using a sterile nail nipper and sent to [facility] lab. Patient tolerated the biopsy procedure well without discomfort or need for anesthesia.  -Discussed fungal nail treatment options including oral, topical, and laser treatments.  Discussed metatarsalgia and treatment options with patient. .  Discussed padding and offloading today.  -Patient to return in 4 weeks for follow up evaluation and discussion of fungal culture  results or sooner if symptoms worsen.   Jennefer Moats, DPM

## 2023-08-27 NOTE — Telephone Encounter (Signed)
 Patches sent on 07/29/2023 & resent today

## 2023-08-28 ENCOUNTER — Ambulatory Visit: Admitting: Podiatry

## 2023-08-31 ENCOUNTER — Other Ambulatory Visit: Payer: Self-pay | Admitting: Family

## 2023-08-31 ENCOUNTER — Ambulatory Visit: Admitting: Podiatry

## 2023-09-01 ENCOUNTER — Other Ambulatory Visit: Payer: Self-pay | Admitting: Family

## 2023-09-01 DIAGNOSIS — E663 Overweight: Secondary | ICD-10-CM

## 2023-09-10 ENCOUNTER — Other Ambulatory Visit: Payer: Self-pay | Admitting: Podiatry

## 2023-09-16 ENCOUNTER — Other Ambulatory Visit

## 2023-09-21 ENCOUNTER — Other Ambulatory Visit: Payer: Self-pay | Admitting: Family

## 2023-09-21 DIAGNOSIS — E785 Hyperlipidemia, unspecified: Secondary | ICD-10-CM

## 2023-09-24 ENCOUNTER — Other Ambulatory Visit: Payer: Self-pay | Admitting: Family

## 2023-09-24 DIAGNOSIS — E785 Hyperlipidemia, unspecified: Secondary | ICD-10-CM

## 2023-09-24 DIAGNOSIS — E663 Overweight: Secondary | ICD-10-CM

## 2023-09-24 DIAGNOSIS — I10 Essential (primary) hypertension: Secondary | ICD-10-CM

## 2023-09-24 DIAGNOSIS — B351 Tinea unguium: Secondary | ICD-10-CM

## 2023-09-24 DIAGNOSIS — T753XXA Motion sickness, initial encounter: Secondary | ICD-10-CM

## 2023-09-24 MED ORDER — OZEMPIC (1 MG/DOSE) 4 MG/3ML ~~LOC~~ SOPN: 1.0000 mg | PEN_INJECTOR | SUBCUTANEOUS | 3 refills | Status: DC

## 2023-10-01 ENCOUNTER — Encounter: Payer: Self-pay | Admitting: Podiatry

## 2023-10-01 ENCOUNTER — Ambulatory Visit (INDEPENDENT_AMBULATORY_CARE_PROVIDER_SITE_OTHER): Admitting: Podiatry

## 2023-10-01 DIAGNOSIS — B351 Tinea unguium: Secondary | ICD-10-CM

## 2023-10-01 MED ORDER — CICLOPIROX 8 % EX SOLN: Freq: Every day | CUTANEOUS | 0 refills | Status: AC

## 2023-10-01 MED ORDER — TERBINAFINE HCL 250 MG PO TABS: 250.0000 mg | ORAL_TABLET | Freq: Every day | ORAL | 2 refills | Status: AC

## 2023-10-01 NOTE — Progress Notes (Signed)
  Subjective:  Patient ID: [name] Steele, female    DOB: 03-11-1966,   MRN: [id number]  Chief Complaint  Patient presents with   Nail Problem    "I haven't had the soreness like before.  It's still there, not any changes."    58 y.o. female presents for follow-up of fungal nails and to discuss results.   Denies any other pedal complaints. Denies n/v/f/c.   Past Medical History:  Diagnosis Date   Chronic low back pain    Hypertension    Myocardial infarction (HCC) 2023   Sleep apnea     Objective:  Physical Exam: Vascular: DP/PT pulses 2/4 bilateral. CFT <3 seconds. Normal hair growth on digits. No edema.  Skin. No lacerations or abrasions bilateral feet. Bilateral hallux nails thickened and dystrophic and noted color changes despite gel nail polish overlying. Most changed noted on riight hallux with distal plate effected about 3/4 way to proximal edge.  Musculoskeletal: MMT 5/5 bilateral lower extremities in DF, PF, Inversion and Eversion. Deceased ROM in DF of ankle joint. Mildly tender to ball of foot bilateral.  Neurological: Sensation intact to light touch.   Assessment:   1. Onychomycosis       Plan:  Patient was evaluated and treated and all questions answered. -Examined patient -Discussed treatment options for painful dystrophic nails  -Culture is positive for fungus. Likely T rubrum.  -Discussed fungal nail treatment options including oral, topical, and laser treatments. Patient would like to proceed with lamisil . Looks like she has a prescription from PCP but has not taken. Will send new prescription in and would like to try penlac as well.   Discussed metatarsalgia and treatment options with patient. .  Discussed padding and offloading today.  -Patient to return in  3 months for recheck.    Jennefer Moats, DPM

## 2023-10-05 ENCOUNTER — Encounter: Payer: Self-pay | Admitting: Family Medicine

## 2023-10-05 ENCOUNTER — Ambulatory Visit (INDEPENDENT_AMBULATORY_CARE_PROVIDER_SITE_OTHER): Admitting: Family Medicine

## 2023-10-05 ENCOUNTER — Ambulatory Visit: Payer: Self-pay

## 2023-10-05 VITALS — BP 118/74 | HR 75 | Ht 66.0 in | Wt 161.0 lb

## 2023-10-05 DIAGNOSIS — M25561 Pain in right knee: Secondary | ICD-10-CM

## 2023-10-05 MED ORDER — MELOXICAM 15 MG PO TABS
15.0000 mg | ORAL_TABLET | Freq: Every day | ORAL | 0 refills | Status: DC
Start: 2023-10-05 — End: 2023-11-09

## 2023-10-05 NOTE — Telephone Encounter (Signed)
 This RN made a second attempt to contact the pt. Pt did not answer. RN LVM with a callback number.

## 2023-10-05 NOTE — Progress Notes (Signed)
 Acute Office Visit  Subjective:     Patient ID: [name] Steele, female    DOB: [birth year], 59 y.o.   MRN: [id number]  Chief Complaint  Patient presents with   Knee Pain     Patient is in today for right knee pain.   Discussed the use of AI scribe software for clinical note transcription with the patient, who gave verbal consent to proceed.  History of Present Illness [name] Steele is a 58 year old female who presents with acute right knee pain and swelling.  She has been experiencing throbbing pain in her right knee since the weekend, describing it as deep in the middle of the knee. The pain is intermittent but was severe enough last night to prevent her from getting comfortable. It is primarily achy and located at the top of the knee.  The knee pain began approximately two weeks ago with mild symptoms, worsening significantly over the past three days. No history of trauma or injury to the knee. She has been using Voltaren  gel for pain relief and has already used an entire bottle. No history of knee problems or previous knee x-rays.  No popping, clicking, or locking of the knee. No history of gastrointestinal bleeds or kidney problems. She is not on any blood thinners.      ROS All review of systems negative except what is listed in the HPI      Objective:    BP 118/74   Pulse 75   Ht 5\' 6"  (1.676 m)   Wt 161 lb (73 kg)   SpO2 96%   BMI 25.99 kg/m    Physical Exam Vitals reviewed.  Constitutional:      Appearance: Normal appearance.  Musculoskeletal:     Right knee: Swelling present. No erythema, ecchymosis, bony tenderness or crepitus. Tenderness present. No ACL laxity or PCL laxity.  Neurological:     Mental Status: She is alert and oriented to person, place, and time.  Psychiatric:        Mood and Affect: Mood normal.        Behavior: Behavior normal.        Thought Content: Thought content normal.        Judgment: Judgment normal.     No  results found for any visits on 10/05/23.      Assessment & Plan:   Problem List Items Addressed This Visit   None Visit Diagnoses       Acute pain of right knee    -  Primary   Relevant Medications   meloxicam (MOBIC) 15 MG tablet   Other Relevant Orders   Ambulatory referral to Sports Medicine   DG Knee 1-2 Views Right      Assessment & Plan Right knee pain with swelling Acute right knee pain with swelling, worsening over three days. Differential includes arthritis flare-up, bursitis, etc. No contraindications for NSAID use. - Order right knee x-ray to assess structural issues. - Refer to sports medicine for further evaluation and management. - Prescribe meloxicam once daily. Advise against ibuprofen  or Aleve simultaneously. - Advise continued use of Voltaren  gel. - Recommend ice, heat, and gentle massage. - Encourage activity within comfort limits, avoiding extreme pain.    Meds ordered this encounter  Medications   meloxicam (MOBIC) 15 MG tablet    Sig: Take 1 tablet (15 mg total) by mouth daily.    Dispense:  30 tablet    Refill:  0    Supervising  Provider:   Neda Balk [phone]    Return if symptoms worsen or fail to improve.  [name] Hock, NP

## 2023-10-05 NOTE — Telephone Encounter (Signed)
 Copied from CRM [id number]. Topic: Clinical - Red Word Triage >> Oct 05, 2023  2:43 PM [place] A wrote: [facility] that prompted transfer to Nurse Triage: Patient is having pain with right knee for three days     1st attempt at calling patient--No answer--Left a voicemail

## 2023-10-05 NOTE — Telephone Encounter (Signed)
 As this RN went to make a third call to the pt, RN noticed the pt checked into an appt at [facility] at 1540 for her symptoms. Reason for Disposition . Third attempt to contact caller AND no contact made. Phone number verified.  Protocols used: No Contact or Duplicate Contact Call-A-AH

## 2023-10-06 ENCOUNTER — Ambulatory Visit (HOSPITAL_BASED_OUTPATIENT_CLINIC_OR_DEPARTMENT_OTHER)
Admission: RE | Admit: 2023-10-06 | Discharge: 2023-10-06 | Disposition: A | Discharge status: Home / Self Care | Source: Ambulatory Visit | Attending: Family Medicine | Admitting: Family Medicine

## 2023-10-06 DIAGNOSIS — M25561 Pain in right knee: Secondary | ICD-10-CM | POA: Insufficient documentation

## 2023-10-07 ENCOUNTER — Encounter: Payer: Self-pay | Admitting: Sports Medicine

## 2023-10-07 ENCOUNTER — Ambulatory Visit (INDEPENDENT_AMBULATORY_CARE_PROVIDER_SITE_OTHER): Admitting: Sports Medicine

## 2023-10-07 ENCOUNTER — Ambulatory Visit: Payer: Self-pay | Admitting: Family Medicine

## 2023-10-07 VITALS — BP 118/70 | Ht 66.0 in | Wt 161.0 lb

## 2023-10-07 DIAGNOSIS — M1711 Unilateral primary osteoarthritis, right knee: Secondary | ICD-10-CM | POA: Diagnosis not present

## 2023-10-07 NOTE — Progress Notes (Signed)
   Subjective:    Patient ID: [name] Steele, female    DOB: Aug 03, 1965, 58 y.o.   MRN: [id number]  HPI chief complaint: Right knee pain  Patient is a very pleasant 58 year old female that presents today with acute on chronic right knee pain.  No known trauma.  No prior knee surgeries.  Pain is in the anterior knee and quite severe.  No swelling.  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  Fairly well-preserved medial and lateral compartments.  She was recently prescribed an NSAID but has yet to start it.  Past medical history reviewed Medications reviewed Allergies reviewed    Review of Systems As above    Objective:   Physical Exam  Well-developed, well-nourished.  No acute distress  Right knee: Good range of motion.  No effusion.  2+ patellofemoral crepitus.  No joint line tenderness.  Good ligamentous stability.  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  I will refer her to Dr.Marchwiany at [facility].  Follow-up with me as needed.  This note was dictated using Dragon naturally speaking software and may contain errors in syntax, spelling, or content which have not been identified prior to signing this note.

## 2023-10-07 NOTE — Patient Instructions (Addendum)
 Dr. Priscille Brought [address] [place], [place] [address] Phone: [phone]

## 2023-11-09 ENCOUNTER — Other Ambulatory Visit: Payer: Self-pay | Admitting: Family Medicine

## 2023-11-09 DIAGNOSIS — M25561 Pain in right knee: Secondary | ICD-10-CM

## 2023-11-09 NOTE — Telephone Encounter (Signed)
 You referred to Sports Med, please advise on refill.

## 2023-11-10 ENCOUNTER — Other Ambulatory Visit: Payer: Self-pay | Admitting: Family

## 2023-11-10 DIAGNOSIS — E663 Overweight: Secondary | ICD-10-CM

## 2023-11-18 ENCOUNTER — Ambulatory Visit: Admitting: Family

## 2023-11-22 ENCOUNTER — Encounter (HOSPITAL_BASED_OUTPATIENT_CLINIC_OR_DEPARTMENT_OTHER): Payer: Self-pay | Admitting: Emergency Medicine

## 2023-11-22 ENCOUNTER — Emergency Department (HOSPITAL_BASED_OUTPATIENT_CLINIC_OR_DEPARTMENT_OTHER)
Admission: EM | Admit: 2023-11-22 | Discharge: 2023-11-22 | Disposition: A | Discharge status: Home / Self Care | Attending: Emergency Medicine | Admitting: Emergency Medicine

## 2023-11-22 DIAGNOSIS — M79601 Pain in right arm: Secondary | ICD-10-CM | POA: Diagnosis present

## 2023-11-22 DIAGNOSIS — Z7982 Long term (current) use of aspirin: Secondary | ICD-10-CM | POA: Insufficient documentation

## 2023-11-22 DIAGNOSIS — M545 Low back pain, unspecified: Secondary | ICD-10-CM | POA: Diagnosis not present

## 2023-11-22 LAB — COMPREHENSIVE METABOLIC PANEL WITH GFR
ALT: 34 U/L (ref 0–44)
AST: 25 U/L (ref 15–41)
Albumin: 3.8 g/dL (ref 3.5–5.0)
Alkaline Phosphatase: 144 U/L — ABNORMAL HIGH (ref 38–126)
Anion gap: 9 (ref 5–15)
BUN: 15 mg/dL (ref 6–20)
CO2: 26 mmol/L (ref 22–32)
Calcium: 8.8 mg/dL — ABNORMAL LOW (ref 8.9–10.3)
Chloride: 108 mmol/L (ref 98–111)
Creatinine, Ser: 0.74 mg/dL (ref 0.44–1.00)
GFR, Estimated: >60 mL/min (ref >60)
Glucose, Bld: 95 mg/dL (ref 70–99)
Potassium: 3.8 mmol/L (ref 3.5–5.1)
Sodium: 143 mmol/L (ref 135–145)
Total Bilirubin: <0.2 mg/dL (ref 0.0–1.2)
Total Protein: 6.1 g/dL — ABNORMAL LOW (ref 6.5–8.1)

## 2023-11-22 LAB — CBC WITH DIFFERENTIAL/PLATELET
Abs Immature Granulocytes: 0.02 K/uL (ref 0.00–0.07)
Basophils Absolute: 0 K/uL (ref 0.0–0.1)
Basophils Relative: 1 %
Eosinophils Absolute: 0.1 K/uL (ref 0.0–0.5)
Eosinophils Relative: 1 %
HCT: 33.7 % — ABNORMAL LOW (ref 36.0–46.0)
Hemoglobin: 11.2 g/dL — ABNORMAL LOW (ref 12.0–15.0)
Immature Granulocytes: 0 %
Lymphocytes Relative: 36 %
Lymphs Abs: 2.1 K/uL (ref 0.7–4.0)
MCH: 32 pg (ref 26.0–34.0)
MCHC: 33.2 g/dL (ref 30.0–36.0)
MCV: 96.3 fL (ref 80.0–100.0)
Monocytes Absolute: 0.4 K/uL (ref 0.1–1.0)
Monocytes Relative: 8 %
Neutro Abs: 3.2 K/uL (ref 1.7–7.7)
Neutrophils Relative %: 54 %
Platelets: 254 K/uL (ref 150–400)
RBC: 3.5 MIL/uL — ABNORMAL LOW (ref 3.87–5.11)
RDW: 12.8 % (ref 11.5–15.5)
WBC: 5.8 K/uL (ref 4.0–10.5)
nRBC: 0 % (ref 0.0–0.2)

## 2023-11-22 LAB — TROPONIN T, HIGH SENSITIVITY: Troponin T High Sensitivity: <6 ng/L (ref <19)

## 2023-11-22 MED ORDER — KETOROLAC TROMETHAMINE 30 MG/ML IJ SOLN
15.0000 mg | Freq: Once | INTRAMUSCULAR | Status: AC
  Administered 2023-11-22: 15 mg via INTRAVENOUS
  Filled 2023-11-22: qty 1

## 2023-11-22 MED ORDER — MORPHINE SULFATE (PF) 4 MG/ML IV SOLN
4.0000 mg | Freq: Once | INTRAVENOUS | Status: AC
  Administered 2023-11-22: 4 mg via INTRAVENOUS
  Filled 2023-11-22: qty 1

## 2023-11-22 NOTE — ED Triage Notes (Signed)
 Pt c/o RUE pain x 1 month; no injury; pain is concentrated in bicep area and forearm

## 2023-11-22 NOTE — Discharge Instructions (Signed)
 You were seen in the emergency department for pain in your right arm Your blood work EKG looked okay You are not having a heart attack tonight The pain may be related to your cervical radiculopathy Please follow-up with your primary care doctor and pain specialist to discuss further management of this issue Return to the emergency department for severe arm pain, chest pain, trouble breathing or any other concerns

## 2023-11-22 NOTE — ED Provider Notes (Signed)
 [facility] Provider Note   CSN: [id number] Arrival date & time: 11/22/23  2049     Patient presents with: Arm Pain   [name] Steele is a 58 y.o. female.  With a history of carpal tunnel syndrome bilaterally and cervical radiculopathy presents to ED for right upper extremity pain.  Patient reports ongoing right upper extremity pain for the last month that has worsened over the last several days.  Pain throughout the upper arm and forearm.  Has a sharp burning quality.  Made better with elevation above her head.  She does take Norco at baseline for chronic low back pain but this has not been helping.  No injury to this arm.  Denies chest pain shortness of breath nausea vomiting diaphoresis.    Arm Pain       Prior to Admission medications   Medication Sig Start Date End Date Taking? Authorizing Provider  amLODipine  (NORVASC ) 5 MG tablet Take 1 tablet (5 mg total) by mouth daily. 09/24/23   O'Sullivan, Melissa, NP  aspirin EC 325 MG tablet Take 325 mg by mouth daily. 11/22/21   [provider]  cetirizine (ZYRTEC) 10 MG tablet TK 1 T PO QD 09/30/16   [provider]  ciclopirox  (PENLAC ) 8 % solution Apply topically at bedtime. Apply over nail and surrounding skin. Apply daily over previous coat. After seven (7) days, may remove with alcohol and continue cycle. 10/01/23   Joya Stabs, DPM  cyanocobalamin  (VITAMIN B12) 1000 MCG/ML injection Inject 1 mL (1,000 mcg total) into the muscle every 30 (thirty) days. 08/27/23   O'Sullivan, Melissa, NP  diclofenac  Sodium (VOLTAREN ) 1 % GEL Apply 2 g topically 4 (four) times daily as needed. 04/17/23   O'Sullivan, Melissa, NP  HYDROcodone -acetaminophen  (NORCO) 10-325 MG tablet Take 1 tablet by mouth every 6 (six) hours as needed. 01/30/22     linaclotide  (LINZESS ) 290 MCG CAPS capsule Take 1 capsule (290 mcg total) by mouth daily as needed. 04/19/23   O'Sullivan, Melissa, NP   meloxicam  (MOBIC ) 15 MG tablet TAKE 1 TABLET(15 MG) BY MOUTH DAILY 11/09/23   Almarie Waddell NOVAK, NP  metoprolol  succinate (TOPROL -XL) 25 MG 24 hr tablet Take 1 tablet (25 mg total) by mouth daily. 06/15/23   Daryl Setter, NP  OZEMPIC , 1 MG/DOSE, 4 MG/3ML SOPN INJECT 1 MG UNDER THE SKIN ONE DAY A WEEK 11/10/23   Daryl Setter, NP  rosuvastatin  (CRESTOR ) 10 MG tablet Take 1 tablet (10 mg total) by mouth daily. 06/15/23   O'Sullivan, Melissa, NP  scopolamine  (TRANSDERM-SCOP) 1 MG/3DAYS APPLY 1 PATCH(1.5 MG) TOPICALLY TO THE SKIN EVERY 3 DAYS 09/24/23   Daryl Setter, NP  SYRINGE-NEEDLE, DISP, 3 ML (LUER LOCK SAFETY SYRINGES) 25G X 1 3 ML MISC 1 each by Does not apply route every 7 (seven) days. 07/31/23   O'Sullivan, Melissa, NP  terbinafine  (LAMISIL ) 250 MG tablet Take 1 tablet (250 mg total) by mouth daily. 10/01/23 12/30/23  Sikora, Rebecca, DPM  tiZANidine (ZANAFLEX) 4 MG tablet Take 4-8 mg by mouth at bedtime. 12/11/21   [provider]    Allergies: Lyrica  [pregabalin ]    Review of Systems  Updated Vital Signs BP 122/85   Pulse 74   Temp 97.8 F (36.6 C)   Resp 16   Ht 5' 6 (1.676 m)   Wt 72.6 kg   SpO2 97%   BMI 25.82 kg/m   Physical Exam Vitals and nursing note reviewed.  HENT:  Head: Normocephalic and atraumatic.  Eyes:     Pupils: Pupils are equal, round, and reactive to light.  Cardiovascular:     Rate and Rhythm: Normal rate and regular rhythm.  Pulmonary:     Effort: Pulmonary effort is normal.     Breath sounds: Normal breath sounds.  Abdominal:     Palpations: Abdomen is soft.     Tenderness: There is no abdominal tenderness.  Musculoskeletal:     Comments: Soft compartments throughout right upper extremity 2+ radial pulse bilaterally 5 out of 5 motor strength bilateral upper extremities Sensation tact light touch throughout Some tenderness to palpation with deep palpation of the right forearm Full active range of motion with flexion  extension at the wrist elbow and shoulder joints.  Skin:    General: Skin is warm and dry.  Neurological:     Mental Status: She is alert.  Psychiatric:        Mood and Affect: Mood normal.     (all labs ordered are listed, but only abnormal results are displayed) Labs Reviewed  CBC WITH DIFFERENTIAL/PLATELET - Abnormal; Notable for the following components:      Result Value   RBC 3.50 (*)    Hemoglobin 11.2 (*)    HCT 33.7 (*)    All other components within normal limits  COMPREHENSIVE METABOLIC PANEL WITH GFR - Abnormal; Notable for the following components:   Calcium  8.8 (*)    Total Protein 6.1 (*)    Alkaline Phosphatase 144 (*)    All other components within normal limits  TROPONIN T, HIGH SENSITIVITY    EKG: EKG Interpretation Date/Time:  Sunday November 22 2023 21:02:04 EDT Ventricular Rate:  74 PR Interval:  168 QRS Duration:  77 QT Interval:  350 QTC Calculation: 389 R Axis:   -1  Text Interpretation: Sinus rhythm Consider anterior infarct Confirmed by Pamella Sharper [phone]) on 11/22/2023 10:20:41 PM  Radiology: No results found.   Procedures   Medications Ordered in the ED  morphine  (PF) 4 MG/ML injection 4 mg (4 mg Intravenous Given 11/22/23 2235)  ketorolac  (TORADOL ) 30 MG/ML injection 15 mg (15 mg Intravenous Given 11/22/23 2235)                                    Medical Decision Making 58 year old female with history as above presenting to the ED for atraumatic right upper extremity pain x 1 month.  Worsened over the last couple days.  No inciting injury or trauma.  Benign physical examination.  Compartments are soft.  Good pulses with motor and sensation intact throughout.  Given right arm pain and cardiovascular risk factors we obtained cardiac workup including EKG high-sensitivity troponin.  Less patient for ACS based on EKG and troponin at this time.  Suspect most likely etiology would be cervical radiculopathy which she has a history of.  She will  follow-up with her PCP and pain management team.  Symptoms improved after a dose of Toradol  and morphine  here.  Appropriate for discharge at this time  Amount and/or Complexity of Data Reviewed Labs: ordered.  Risk Prescription drug management.        Final diagnoses:  Right arm pain    ED Discharge Orders     None          Pamella Sharper LABOR, DO 11/22/23 2307

## 2023-12-01 ENCOUNTER — Ambulatory Visit: Admitting: Family

## 2023-12-08 ENCOUNTER — Encounter: Payer: Self-pay | Admitting: Family

## 2023-12-08 ENCOUNTER — Ambulatory Visit (INDEPENDENT_AMBULATORY_CARE_PROVIDER_SITE_OTHER): Admitting: Family

## 2023-12-08 VITALS — BP 109/68 | HR 81 | Temp 99.4°F | Resp 16 | Ht 66.0 in | Wt 156.0 lb

## 2023-12-08 DIAGNOSIS — K581 Irritable bowel syndrome with constipation: Secondary | ICD-10-CM | POA: Insufficient documentation

## 2023-12-08 DIAGNOSIS — I10 Essential (primary) hypertension: Secondary | ICD-10-CM

## 2023-12-08 DIAGNOSIS — E559 Vitamin D deficiency, unspecified: Secondary | ICD-10-CM

## 2023-12-08 DIAGNOSIS — D509 Iron deficiency anemia, unspecified: Secondary | ICD-10-CM

## 2023-12-08 DIAGNOSIS — E538 Deficiency of other specified B group vitamins: Secondary | ICD-10-CM | POA: Diagnosis not present

## 2023-12-08 DIAGNOSIS — G4730 Sleep apnea, unspecified: Secondary | ICD-10-CM | POA: Diagnosis not present

## 2023-12-08 DIAGNOSIS — B351 Tinea unguium: Secondary | ICD-10-CM

## 2023-12-08 DIAGNOSIS — E785 Hyperlipidemia, unspecified: Secondary | ICD-10-CM | POA: Diagnosis not present

## 2023-12-08 MED ORDER — ROSUVASTATIN CALCIUM 10 MG PO TABS: 10.0000 mg | ORAL_TABLET | Freq: Every day | ORAL | 1 refills | Status: DC

## 2023-12-08 NOTE — Assessment & Plan Note (Addendum)
 She is taking 50000 international units weekly otc. Update level.

## 2023-12-08 NOTE — Assessment & Plan Note (Signed)
 BP stable on toprol  xl and amlodipine .

## 2023-12-08 NOTE — Progress Notes (Signed)
 Subjective:     Patient ID: [name] Steele, female    DOB: 11-17-1965, 58 y.o.   MRN: [id number]  Chief Complaint  Patient presents with   Hypertension    Here for follow up    Hypertension    Discussed the use of AI scribe software for clinical note transcription with the patient, who gave verbal consent to proceed.  History of Present Illness  [name] Steele is a 58 year old female with hypertension who presents for a follow-up on blood pressure management. She is on amlodipine  5 mg and Toprol  XL 25 mg for hypertension. She has elevated cholesterol but is not taking rosuvastatin . Her cholesterol level will be checked today. She takes vitamin D  50,000 units weekly. She has lost over 30 pounds since September, now weighing 161 pounds, due to Ozempic  and lifestyle changes. She is not using CPAP and reports improved sleep quality and less daytime sleepiness with weight loss. She receives B12 injections monthly, with the last one administered on Monday. She finds self-administration challenging and often receives them at the office. She experiences persistent tinnitus in her left ear. No recent issues with sciatica. She has arm pain and is under the care of Dr. Ruhine, who has conducted an MRI of her neck and side. She mentions a potential neck issue that may require further intervention. She discontinued Lamisil  for toenail fungus due to perceived ineffectiveness, though there is some improvement in new nail growth. She is mildly anemic based on a blood count done two weeks ago and is awaiting further evaluation of her iron levels.      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   Hypertension    Myocardial infarction [facility]) 2023   Sciatica of right side 03/12/2022   Sleep apnea     Past Surgical History:  Procedure Laterality Date   ABDOMINAL HYSTERECTOMY     BREAST REDUCTION SURGERY     FOOT SURGERY     GASTRIC BYPASS     TUBAL LIGATION      Family History  Problem Relation Age of Onset   COPD Mother    Throat cancer Mother    Diabetes Father    Kidney disease Father    Breast cancer Sister     Social History   Socioeconomic History   Marital status: Single    Spouse name: Not on file   Number of children: Not on file   Years of education: Not on file   Highest education level: Not on file  Occupational History   Not on file  Tobacco Use   Smoking status: Never    Passive exposure: Never   Smokeless tobacco: Never  Vaping Use   Vaping status: Former  Substance and Sexual Activity   Alcohol use: No   Drug use: No   Sexual activity: Yes    Birth control/protection: Surgical  Other Topics Concern   Not on file  Social History Narrative   3 sons (one set of twins), grown   10 grandchildren (family is  local)   Lives with father or her children   Raising her great nephew (this is a stressor)   Works in Therapist, occupational,  runs a venue for parties   Completed some college   Enjoys cooking   No pets    Social Drivers of Corporate investment banker Strain: Medium Risk (12/31/2022)   Overall Financial Resource Strain (CARDIA)    Difficulty of Paying Living Expenses: Somewhat hard  Food Insecurity: No Food Insecurity (12/31/2022)   Hunger Vital Sign    Worried About Running Out of Food in the Last Year: Never true    Ran Out of Food in the Last Year: Never true  Transportation Needs: No Transportation Needs (12/31/2022)   PRAPARE - Administrator, Civil Service (Medical): No    Lack of Transportation (Non-Medical): No  Physical Activity: Inactive  (12/31/2022)   Exercise Vital Sign    Days of Exercise per Week: 0 days    Minutes of Exercise per Session: 0 min  Stress: Stress Concern Present (12/31/2022)   [employer] of Occupational Health - Occupational Stress Questionnaire    Feeling of Stress : Very much  Social Connections: Moderately Isolated (12/31/2022)   Social Connection and Isolation Panel    Frequency of Communication with Friends and Family: More than three times a week    Frequency of Social Gatherings with Friends and Family: Once a week    Attends Religious Services: More than 4 times per year    Active Member of [employer] or Organizations: No    Attends Banker Meetings: Never    Marital Status: Never married  Intimate Partner Violence: Not At Risk (12/31/2022)   Humiliation, Afraid, Rape, and Kick questionnaire    Fear of Current or Ex-Partner: No    Emotionally Abused: No    Physically Abused: No    Sexually Abused: No    Outpatient Medications Prior to Visit  Medication Sig Dispense Refill   amLODipine  (NORVASC ) 5 MG tablet Take 1 tablet (5 mg total) by mouth daily. 90 tablet 0   aspirin EC 325 MG tablet Take 325 mg by mouth daily.     cetirizine (ZYRTEC) 10 MG tablet TK 1 T PO QD  0   ciclopirox  (PENLAC ) 8 % solution Apply topically at bedtime. Apply over nail and surrounding skin. Apply daily over previous coat. After seven (7) days, may remove with alcohol and continue cycle. 6.6 mL 0   cyanocobalamin  (VITAMIN B12) 1000 MCG/ML injection Inject 1 mL (1,000 mcg total) into the muscle every 30 (thirty) days. 12 mL 0   diclofenac  Sodium (VOLTAREN ) 1 % GEL Apply 2 g topically 4 (four) times daily as needed. 100 g 5   HYDROcodone -acetaminophen  (NORCO) 10-325 MG tablet Take 1 tablet by mouth every 6 (six) hours as needed. 120 tablet 0   linaclotide  (LINZESS ) 290 MCG CAPS capsule Take 1 capsule (290 mcg total) by mouth daily as needed.     meloxicam  (MOBIC ) 15 MG tablet TAKE 1 TABLET(15 MG) BY MOUTH  DAILY 30 tablet 0   metoprolol  succinate (TOPROL -XL) 25 MG 24 hr tablet Take 1 tablet (25 mg total) by mouth daily. 90 tablet 1   OZEMPIC , 1 MG/DOSE, 4 MG/3ML SOPN INJECT 1 MG UNDER THE SKIN ONE DAY A WEEK 3 mL 3   scopolamine  (TRANSDERM-SCOP) 1 MG/3DAYS APPLY 1 PATCH(1.5 MG) TOPICALLY TO THE SKIN EVERY 3 DAYS 4 patch 0   SYRINGE-NEEDLE, DISP, 3 ML (LUER LOCK SAFETY SYRINGES) 25G  X 1 3 ML MISC 1 each by Does not apply route every 7 (seven) days. 8 each 0   terbinafine  (LAMISIL ) 250 MG tablet Take 1 tablet (250 mg total) by mouth daily. 30 tablet 2   tiZANidine (ZANAFLEX) 4 MG tablet Take 4-8 mg by mouth at bedtime.     rosuvastatin  (CRESTOR ) 10 MG tablet Take 1 tablet (10 mg total) by mouth daily. 90 tablet 1   No facility-administered medications prior to visit.    Allergies  Allergen Reactions   Lyrica  [Pregabalin ]     nausea    ROS See HPI    Objective:    Physical Exam Constitutional:      General: She is not in acute distress.    Appearance: Normal appearance. She is well-developed.  HENT:     Head: Normocephalic and atraumatic.     Right Ear: External ear normal.     Left Ear: External ear normal.  Eyes:     General: No scleral icterus. Neck:     Thyroid : No thyromegaly.  Cardiovascular:     Rate and Rhythm: Normal rate and regular rhythm.     Heart sounds: Normal heart sounds. No murmur heard. Pulmonary:     Effort: Pulmonary effort is normal. No respiratory distress.     Breath sounds: Normal breath sounds. No wheezing.  Musculoskeletal:     Cervical back: Neck supple.  Skin:    General: Skin is warm and dry.     Comments: Less discoloration noted at the base of the right great toenail  Neurological:     Mental Status: She is alert and oriented to person, place, and time.  Psychiatric:        Mood and Affect: Mood normal.        Behavior: Behavior normal.        Thought Content: Thought content normal.        Judgment: Judgment normal.      BP 109/68  (BP Location: Right Arm, Patient Position: Sitting, Cuff Size: Normal)   Pulse 81   Temp 99.4 F (37.4 C) (Oral)   Resp 16   Ht 5' 6 (1.676 m)   Wt 156 lb (70.8 kg)   SpO2 100%   BMI 25.18 kg/m  Wt Readings from Last 3 Encounters:  12/08/23 156 lb (70.8 kg)  11/22/23 160 lb (72.6 kg)  10/07/23 161 lb (73 kg)        Assessment & Plan:   Problem List Items Addressed This Visit       Unprioritized   Vitamin D  deficiency   She is taking 50000 international units weekly otc. Update level.      Relevant Orders   Vitamin D  (25 hydroxy)   Sleep apnea   Declines CPAP- focusing on weight loss.       Onychomycosis   Did not complete lamisil .  Appears improved with the 1 month rx.      Irritable bowel syndrome with constipation   Using linzess  prn. Stable.       IDA (iron deficiency anemia)   Lab Results  Component Value Date   WBC 5.8 11/22/2023   HGB 11.2 (L) 11/22/2023   HCT 33.7 (L) 11/22/2023   MCV 96.3 11/22/2023   PLT 254 11/22/2023         Relevant Orders   IBC + Ferritin   Hypertension - Primary   BP stable on toprol  xl and amlodipine .         Relevant  Medications   rosuvastatin  (CRESTOR ) 10 MG tablet   Hyperlipidemia   Not taking crestor , update lipid panel. Restart crestor  for CV risk reduction.      Relevant Medications   rosuvastatin  (CRESTOR ) 10 MG tablet   Other Relevant Orders   Lipid panel   B12 deficiency   She has been taking injection once weekly- advised to change to once monthly. Update level.       Relevant Orders   B12    I have discontinued [name] Steele. [name] Steele's rosuvastatin . I am also having her start on rosuvastatin . Additionally, I am having her maintain her cetirizine, tiZANidine, aspirin EC, HYDROcodone -acetaminophen , diclofenac  Sodium, linaclotide , metoprolol  succinate, Luer Lock Safety Syringes, cyanocobalamin , amLODipine , scopolamine , ciclopirox , terbinafine , meloxicam , and Ozempic  (1 MG/DOSE).  Meds ordered this  encounter  Medications   rosuvastatin  (CRESTOR ) 10 MG tablet    Sig: Take 1 tablet (10 mg total) by mouth daily.    Dispense:  90 tablet    Refill:  1    Supervising Provider:   DOMENICA BLACKBIRD A [[id number]]

## 2023-12-08 NOTE — Assessment & Plan Note (Signed)
 She has been taking injection once weekly- advised to change to once monthly. Update level.

## 2023-12-08 NOTE — Assessment & Plan Note (Signed)
 Lab Results  Component Value Date   WBC 5.8 11/22/2023   HGB 11.2 (L) 11/22/2023   HCT 33.7 (L) 11/22/2023   MCV 96.3 11/22/2023   PLT 254 11/22/2023

## 2023-12-08 NOTE — Patient Instructions (Signed)
 VISIT SUMMARY:  Today, we reviewed your blood pressure management, cholesterol levels, weight loss progress, and other health concerns. We made some adjustments to your medications and ordered tests to monitor your condition.  YOUR PLAN:  HYPERTENSION: Your blood pressure is well-controlled with your current medications. -Continue taking amlodipine  5 mg and Toprol  XL 25 mg daily.  HYPERLIPIDEMIA: You have high cholesterol, which requires medication to manage. -We will check your cholesterol levels today. -Start taking rosuvastatin  once daily in the evening.  OBESITY: You have lost over 30 pounds with the help of Ozempic  and lifestyle changes. -Continue using Ozempic  for weight management.  OBSTRUCTIVE SLEEP APNEA: Your sleep apnea symptoms have improved with weight loss. -No changes needed at this time.  IRON DEFICIENCY ANEMIA (SUSPECTED): You have mild anemia, and we suspect it may be due to low iron levels. -We will check your iron levels to see if you need supplements.  VITAMIN B12 DEFICIENCY: You are receiving monthly B12 injections, which are appropriate for your condition. -Continue with monthly B12 injections. -We will check your B12 levels to ensure they are adequate.  CONSTIPATION: You use Linzess  as needed for bloating, but it requires more than one dose to be effective. -Continue taking Linzess  as needed.  ONYCHOMYCOSIS: You have seen some improvement in your toenail fungus after stopping Lamisil . -Monitor your nail growth. If there is no further improvement, we may consider repeating Lamisil  for 12 weeks.

## 2023-12-08 NOTE — Assessment & Plan Note (Signed)
 Using linzess  prn. Stable.

## 2023-12-08 NOTE — Assessment & Plan Note (Signed)
 Did not complete lamisil .  Appears improved with the 1 month rx.

## 2023-12-08 NOTE — Assessment & Plan Note (Signed)
 Declines CPAP- focusing on weight loss.

## 2023-12-08 NOTE — Assessment & Plan Note (Addendum)
 Not taking crestor , update lipid panel. Restart crestor  for CV risk reduction.

## 2023-12-09 ENCOUNTER — Other Ambulatory Visit: Payer: Self-pay | Admitting: Family Medicine

## 2023-12-09 DIAGNOSIS — M25561 Pain in right knee: Secondary | ICD-10-CM

## 2023-12-09 LAB — IBC + FERRITIN
Ferritin: 22.3 ng/mL (ref 10.0–291.0)
Iron: 58 ug/dL (ref 42–145)
Saturation Ratios: 16.1 % — ABNORMAL LOW (ref 20.0–50.0)
TIBC: 361.2 ug/dL (ref 250.0–450.0)
Transferrin: 258 mg/dL (ref 212.0–360.0)

## 2023-12-09 LAB — LIPID PANEL
Cholesterol: 209 mg/dL — ABNORMAL HIGH (ref 0–200)
HDL: 67.8 mg/dL (ref >39.00)
LDL Cholesterol: 116 mg/dL — ABNORMAL HIGH (ref 0–99)
NonHDL: 141.06
Total CHOL/HDL Ratio: 3
Triglycerides: 127 mg/dL (ref 0.0–149.0)
VLDL: 25.4 mg/dL (ref 0.0–40.0)

## 2023-12-09 LAB — VITAMIN D 25 HYDROXY (VIT D DEFICIENCY, FRACTURES): VITD: 58.16 ng/mL (ref 30.00–100.00)

## 2023-12-09 LAB — VITAMIN B12: Vitamin B-12: 233 pg/mL (ref 211–911)

## 2023-12-10 ENCOUNTER — Ambulatory Visit: Payer: Self-pay | Admitting: Family

## 2023-12-10 DIAGNOSIS — E538 Deficiency of other specified B group vitamins: Secondary | ICD-10-CM

## 2023-12-10 MED ORDER — CYANOCOBALAMIN 1000 MCG/ML IJ SOLN: INTRAMUSCULAR | 0 refills | Status: AC

## 2023-12-10 NOTE — Telephone Encounter (Signed)
 Called patient twice but no answer, left voice mail for patient to call back.

## 2023-12-10 NOTE — Telephone Encounter (Signed)
 B12 is still low.  I would like for her to take b12 injection 1000 mcg IM weekly x 4 then monthly. Repeat b12 level in 3 months. Refill sent to [facility].   Cholesterol is mildly elevated. Please continue work on diet/exercise.  Iron level is stable.

## 2023-12-15 ENCOUNTER — Ambulatory Visit

## 2023-12-18 ENCOUNTER — Telehealth: Payer: Self-pay | Admitting: Family

## 2023-12-18 NOTE — Telephone Encounter (Signed)
 See mychart.

## 2024-01-01 ENCOUNTER — Ambulatory Visit: Admitting: Podiatry

## 2024-01-01 DIAGNOSIS — Z91199 Patient's noncompliance with other medical treatment and regimen due to unspecified reason: Secondary | ICD-10-CM

## 2024-01-01 NOTE — Progress Notes (Signed)
 No show

## 2024-01-05 ENCOUNTER — Other Ambulatory Visit: Payer: Self-pay | Admitting: Family

## 2024-01-05 ENCOUNTER — Ambulatory Visit (INDEPENDENT_AMBULATORY_CARE_PROVIDER_SITE_OTHER)

## 2024-01-05 VITALS — Ht 66.0 in | Wt 156.0 lb

## 2024-01-05 DIAGNOSIS — Z0000 Encounter for general adult medical examination without abnormal findings: Secondary | ICD-10-CM

## 2024-01-05 DIAGNOSIS — M25561 Pain in right knee: Secondary | ICD-10-CM

## 2024-01-05 NOTE — Progress Notes (Signed)
 Subjective:   [name] Steele is a 58 y.o. who presents for a Medicare Wellness preventive visit.  As a reminder, Annual Wellness Visits don't include a physical exam, and some assessments may be limited, especially if this visit is performed virtually. We may recommend an in-person follow-up visit with your provider if needed.  Visit Complete: Virtual I connected with  [name] Steele on 01/05/24 by a audio enabled telemedicine application and verified that I am speaking with the correct person using two identifiers.  Patient Location: Home  Provider Location: Home Office  I discussed the limitations of evaluation and management by telemedicine. The patient expressed understanding and agreed to proceed.  Vital Signs: Because this visit was a virtual/telehealth visit, some criteria may be missing or patient reported. Any vitals not documented were not able to be obtained and vitals that have been documented are patient reported.  VideoDeclined- This patient declined Librarian, academic. Therefore the visit was completed with audio only.  Persons Participating in Visit: Patient.  AWV Questionnaire: No: Patient Medicare AWV questionnaire was not completed prior to this visit.  Cardiac Risk Factors include: dyslipidemia;hypertension     Objective:    Today's Vitals   01/05/24 0933 01/05/24 0934  Weight: 156 lb (70.8 kg)   Height: 5' 6 (1.676 m)   PainSc:  6    Body mass index is 25.18 kg/m.     01/05/2024    9:41 AM 11/22/2023    8:57 PM 12/31/2022    9:13 AM 04/23/2022    3:02 PM 03/15/2022    2:42 PM 03/06/2022    9:07 AM 03/04/2022    9:55 PM  Advanced Directives  Does Patient Have a Medical Advance Directive? No No No No No No No  Would patient like information on creating a medical advance directive? Yes (MAU/Ambulatory/Procedural Areas - Information given)  No - Patient declined No - Patient declined  No - Guardian declined      Current Medications (verified) Outpatient Encounter Medications as of 01/05/2024  Medication Sig   amLODipine  (NORVASC ) 5 MG tablet Take 1 tablet (5 mg total) by mouth daily.   aspirin EC 325 MG tablet Take 325 mg by mouth daily.   buprenorphine (BUTRANS) 5 MCG/HR PTWK 1 patch once a week.   cetirizine (ZYRTEC) 10 MG tablet TK 1 T PO QD   ciclopirox  (PENLAC ) 8 % solution Apply topically at bedtime. Apply over nail and surrounding skin. Apply daily over previous coat. After seven (7) days, may remove with alcohol and continue cycle.   cyanocobalamin  (VITAMIN B12) 1000 MCG/ML injection 1000 mcg IM weekly x 4, then once monthly   diclofenac  Sodium (VOLTAREN ) 1 % GEL Apply 2 g topically 4 (four) times daily as needed.   HYDROcodone -acetaminophen  (NORCO) 10-325 MG tablet Take 1 tablet by mouth every 6 (six) hours as needed.   linaclotide  (LINZESS ) 290 MCG CAPS capsule Take 1 capsule (290 mcg total) by mouth daily as needed.   meloxicam  (MOBIC ) 15 MG tablet TAKE 1 TABLET(15 MG) BY MOUTH DAILY   metoprolol  succinate (TOPROL -XL) 25 MG 24 hr tablet Take 1 tablet (25 mg total) by mouth daily.   OZEMPIC , 1 MG/DOSE, 4 MG/3ML SOPN INJECT 1 MG UNDER THE SKIN ONE DAY A WEEK   rosuvastatin  (CRESTOR ) 10 MG tablet Take 1 tablet (10 mg total) by mouth daily.   scopolamine  (TRANSDERM-SCOP) 1 MG/3DAYS APPLY 1 PATCH(1.5 MG) TOPICALLY TO THE SKIN EVERY 3 DAYS   SYRINGE-NEEDLE, DISP, 3  ML (LUER LOCK SAFETY SYRINGES) 25G X 1 3 ML MISC 1 each by Does not apply route every 7 (seven) days.   tiZANidine (ZANAFLEX) 4 MG tablet Take 4-8 mg by mouth at bedtime.   No facility-administered encounter medications on file as of 01/05/2024.    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   Hypertension    Myocardial infarction [facility]) 2023   Sciatica of right side 03/12/2022   Sleep apnea    Past Surgical History:   Procedure Laterality Date   ABDOMINAL HYSTERECTOMY     BREAST REDUCTION SURGERY     FOOT SURGERY     GASTRIC BYPASS     TUBAL LIGATION     Family History  Problem Relation Age of Onset   COPD Mother    Throat cancer Mother    Diabetes Father    Kidney disease Father    Breast cancer Sister    Social History   Socioeconomic History   Marital status: Single    Spouse name: Not on file   Number of children: Not on file   Years of education: Not on file   Highest education level: Not on file  Occupational History   Not on file  Tobacco Use   Smoking status: Never    Passive exposure: Never   Smokeless tobacco: Never  Vaping Use   Vaping status: Former  Substance and Sexual Activity   Alcohol use: No   Drug use: No   Sexual activity: Yes    Birth control/protection: Surgical  Other Topics Concern   Not on file  Social History Narrative   3 sons (one set of twins), grown   10 grandchildren (family is local)   Lives with father or her children   Raising her great nephew (this is a stressor)   Works in Therapist, occupational,  runs a venue for parties   Completed some college   Enjoys cooking   No pets    Social Drivers of Corporate investment banker Strain: Low Risk  (01/05/2024)   Overall Financial Resource Strain (CARDIA)    Difficulty of Paying Living Expenses: Not very hard  Food Insecurity: No Food Insecurity (01/05/2024)   Hunger Vital Sign    Worried About Running Out of Food in the Last Year: Never true    Ran Out of Food in the Last Year: Never true  Transportation Needs: No Transportation Needs (01/05/2024)   PRAPARE - Administrator, Civil Service (Medical): No    Lack of Transportation (Non-Medical): No  Physical Activity: Inactive (01/05/2024)   Exercise Vital Sign    Days of Exercise per Week: 0 days    Minutes of Exercise per Session: 0 min  Stress: No Stress Concern Present (01/05/2024)   [employer] of Occupational Health - Occupational Stress  Questionnaire    Feeling of Stress: Only a little  Social Connections: Moderately Isolated (01/05/2024)   Social Connection and Isolation Panel    Frequency of Communication with Friends and Family: More than three times a week    Frequency of Social Gatherings with Friends and Family: Once a week    Attends Religious Services: More than 4 times per year    Active Member of [employer] or Organizations: No    Attends Banker Meetings: Never    Marital Status: Never married    Tobacco Counseling Counseling  given: Not Answered    Clinical Intake:  Pre-visit preparation completed: Yes  Pain : 0-10 Pain Score: 6  Pain Type: Chronic pain Pain Location: Back  Diabetes: No  Lab Results  Component Value Date   HGBA1C 5.6 04/17/2023     How often do you need to have someone help you when you read instructions, pamphlets, or other written materials from your doctor or pharmacy?: 1 - Never  Interpreter Needed?: No  Information entered by :: Charmaine Bloodgood LPN   Activities of Daily Living     01/05/2024    9:41 AM  In your present state of health, do you have any difficulty performing the following activities:  Hearing? 0  Vision? 0  Difficulty concentrating or making decisions? 0  Walking or climbing stairs? 0  Dressing or bathing? 0  Doing errands, shopping? 0  Preparing Food and eating ? N  Using the Toilet? N  In the past six months, have you accidently leaked urine? N  Do you have problems with loss of bowel control? N  Managing your Medications? N  Managing your Finances? N  Housekeeping or managing your Housekeeping? N    Patient Care Team: Daryl Setter, NP as PCP - General (Internal Medicine) Euna Read, MD as Referring Physician (Neurosurgery)  I have updated your Care Teams any recent Medical Services you may have received from other providers in the past year.     Assessment:   This is a routine wellness examination for  [place].  Hearing/Vision screen Hearing Screening - Comments:: Denies hearing difficulties   Vision Screening - Comments:: No vision problems   Goals Addressed             This Visit's Progress    Manage pain appropriately   On track      Depression Screen     01/05/2024    9:42 AM 12/08/2023    3:10 PM 12/31/2022    9:13 AM 03/12/2022   10:21 AM  PHQ 2/9 Scores  PHQ - 2 Score 4 4 0 0  PHQ- 9 Score 20 22      Fall Risk     01/05/2024    9:41 AM 12/08/2023    3:10 PM 12/08/2023    2:18 PM 12/31/2022    9:07 AM  Fall Risk   Falls in the past year? 0 0 0 0  Number falls in past yr: 0 0 0 0  Injury with Fall? 0 0 0 0  Risk for fall due to : Impaired mobility No Fall Risks No Fall Risks No Fall Risks  Follow up Falls prevention discussed;Education provided;Falls evaluation completed Falls evaluation completed Falls evaluation completed Falls evaluation completed    MEDICARE RISK AT HOME:  Medicare Risk at Home Any stairs in or around the home?: No If so, are there any without handrails?: No Home free of loose throw rugs in walkways, pet beds, electrical cords, etc?: Yes Adequate lighting in your home to reduce risk of falls?: Yes Life alert?: No Use of a cane, walker or w/c?: No Grab bars in the bathroom?: Yes Shower chair or bench in shower?: No Elevated toilet seat or a handicapped toilet?: No  TIMED UP AND GO:  Was the test performed?  No  Cognitive Function: 6CIT completed    12/31/2022    9:20 AM  MMSE - Mini Mental State Exam  Not completed: Unable to complete        01/05/2024    9:41 AM  6CIT Screen  What Year? 0 points  What month? 0 points  What time? 0 points  Count back from 20 0 points  Months in reverse 0 points  Repeat phrase 0 points  Total Score 0 points    Immunizations Immunization History  Administered Date(s) Administered   Influenza, Quadrivalent, Recombinant, Inj, Pf 05/13/2013   Influenza-Unspecified 02/12/2011, 01/19/2014    PFIZER(Purple Top)SARS-COV-2 Vaccination 07/21/2019, 08/16/2019   Tdap 07/12/2011    Screening Tests Health Maintenance  Topic Date Due   Zoster Vaccines- Shingrix (1 of 2) Never done   COVID-19 Vaccine (3 - Pfizer risk series) 09/13/2019   DTaP/Tdap/Td (2 - Td or Tdap) 07/11/2021   INFLUENZA VACCINE  08/02/2024 (Originally 12/04/2023)   Pneumococcal Vaccine: 50+ Years (1 of 2 - PCV) 12/07/2024 (Originally [birth year]08/1984)   Hepatitis B Vaccines 19-59 Average Risk (1 of 3 - 19+ 3-dose series) 12/07/2024 (Originally 08/08/1984)   Medicare Annual Wellness (AWV)  01/04/2025   MAMMOGRAM  07/29/2025   Colonoscopy  11/29/2030   HPV VACCINES  Aged Out   Meningococcal B Vaccine  Aged Out   Hepatitis C Screening  Discontinued   HIV Screening  Discontinued    Health Maintenance  Health Maintenance Due  Topic Date Due   Zoster Vaccines- Shingrix (1 of 2) Never done   COVID-19 Vaccine (3 - Pfizer risk series) 09/13/2019   DTaP/Tdap/Td (2 - Td or Tdap) 07/11/2021   Health Maintenance Items Addressed: Patient declines vaccines   Additional Screening:  Vision Screening: Recommended annual ophthalmology exams for early detection of glaucoma and other disorders of the eye. Would you like a referral to an eye doctor? No    Dental Screening: Recommended annual dental exams for proper oral hygiene  Community Resource Referral / Chronic Care Management: CRR required this visit?  No   CCM required this visit?  No   Plan:    I have personally reviewed and noted the following in the patient's chart:   Medical and social history Use of alcohol, tobacco or illicit drugs  Current medications and supplements including opioid prescriptions. Patient is currently taking opioid prescriptions. Information provided to patient regarding non-opioid alternatives. Patient advised to discuss non-opioid treatment plan with their provider. Functional ability and status Nutritional status Physical  activity Advanced directives List of other physicians Hospitalizations, surgeries, and ER visits in previous 12 months Vitals Screenings to include cognitive, depression, and falls Referrals and appointments  In addition, I have reviewed and discussed with patient certain preventive protocols, quality metrics, and best practice recommendations. A written personalized care plan for preventive services as well as general preventive health recommendations were provided to patient.   Lavelle Pfeiffer [place], [place]   [id number]   After Visit Summary: (MyChart) Due to this being a telephonic visit, the after visit summary with patients personalized plan was offered to patient via MyChart   Notes: Nothing significant to report at this time.

## 2024-01-05 NOTE — Patient Instructions (Signed)
 [name] Steele , Thank you for taking time out of your busy schedule to complete your Annual Wellness Visit with me. I enjoyed our conversation and look forward to speaking with you again next year. I, as well as your care team,  appreciate your ongoing commitment to your health goals. Please review the following plan we discussed and let me know if I can assist you in the future. Your Game plan/ To Do List     Follow up Visits: We will see or speak with you next year for your Next Medicare AWV with our clinical staff Have you seen your provider in the last 6 months (3 months if uncontrolled diabetes)? Yes  Clinician Recommendations:  Aim for 30 minutes of exercise or brisk walking, 6-8 glasses of water, and 5 servings of fruits and vegetables each day.       This is a list of the screenings recommended for you:  Health Maintenance  Topic Date Due   Zoster (Shingles) Vaccine (1 of 2) Never done   COVID-19 Vaccine (3 - Pfizer risk series) 09/13/2019   DTaP/Tdap/Td vaccine (2 - Td or Tdap) 07/11/2021   Flu Shot  08/02/2024*   Pneumococcal Vaccine for age over 66 (1 of 2 - PCV) 12/07/2024*   Hepatitis B Vaccine (1 of 3 - 19+ 3-dose series) 12/07/2024*   Medicare Annual Wellness Visit  01/04/2025   Mammogram  07/29/2025   Colon Cancer Screening  11/29/2030   HPV Vaccine  Aged Out   Meningitis B Vaccine  Aged Out   Hepatitis C Screening  Discontinued   HIV Screening  Discontinued  *Topic was postponed. The date shown is not the original due date.    Advanced directives: (ACP Link)Information on Advanced Care Planning can be found at [place]  Secretary of [facility] Advance Health Care Directives Advance Health Care Directives. [email or web address]   Advance Care Planning is important because it:  [x]  Makes sure you receive the medical care that is consistent with your values, goals, and preferences  [x]  It provides guidance to your family and loved ones and reduces their decisional burden about  whether or not they are making the right decisions based on your wishes.  Follow the link provided in your after visit summary or read over the paperwork we have mailed to you to help you started getting your Advance Directives in place. If you need assistance in completing these, please reach out to us  so that we can help you!  See attachments for Preventive Care and Fall Prevention Tips.

## 2024-01-11 ENCOUNTER — Encounter: Payer: Self-pay | Admitting: Family

## 2024-02-10 ENCOUNTER — Other Ambulatory Visit: Payer: Self-pay | Admitting: Family

## 2024-02-10 DIAGNOSIS — M25561 Pain in right knee: Secondary | ICD-10-CM

## 2024-05-06 ENCOUNTER — Encounter: Payer: Self-pay | Admitting: Family

## 2024-05-07 ENCOUNTER — Encounter: Payer: Self-pay | Admitting: Family

## 2024-05-07 DIAGNOSIS — M79643 Pain in unspecified hand: Secondary | ICD-10-CM | POA: Insufficient documentation

## 2024-05-07 NOTE — Assessment & Plan Note (Signed)
 And swelling, bilateral. Stiffness, pain and swelling every morning.  Encouraged exercises and check labs. No redness or warmth

## 2024-05-07 NOTE — Progress Notes (Unsigned)
 "  Subjective:    Patient ID: [name] Steele, female    DOB: 03-05-1966, 59 y.o.   MRN: [id number]  No chief complaint on file.   HPI Discussed the use of AI scribe software for clinical note transcription with the patient, who gave verbal consent to proceed.  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   Hypertension    Myocardial infarction [facility]) 2023   Sciatica of right side 03/12/2022   Sleep apnea     Past Surgical History:  Procedure Laterality Date   ABDOMINAL HYSTERECTOMY     BREAST REDUCTION SURGERY     FOOT SURGERY     GASTRIC BYPASS     TUBAL LIGATION      Family History  Problem Relation Age of Onset   COPD Mother    Throat cancer Mother    Diabetes Father    Kidney disease Father    Breast cancer Sister     Social History   Socioeconomic History   Marital status: Single    Spouse name: Not on file   Number of children: Not on file   Years of education: Not on file   Highest education level: Not on file  Occupational History   Not on file  Tobacco Use   Smoking status: Never    Passive exposure: Never   Smokeless tobacco: Never  Vaping Use   Vaping status: Former  Substance and Sexual Activity   Alcohol use: No   Drug use: No   Sexual activity: Yes    Birth control/protection: Surgical  Other Topics Concern   Not on file  Social History Narrative   3 sons (one set of twins), grown   10 grandchildren (family is local)   Lives with father or her children   Raising her great nephew (this is a stressor)   Works in therapist, occupational,  runs a venue for parties   Completed some college   Enjoys cooking   No pets    Social Drivers of Health   Tobacco Use: Low Risk (01/05/2024)   Patient History    Smoking Tobacco Use: Never    Smokeless Tobacco Use: Never    Passive Exposure: Never  Financial Resource Strain: Low Risk  (01/05/2024)   Overall Financial Resource Strain (CARDIA)    Difficulty of Paying Living Expenses: Not very hard  Food Insecurity: No Food Insecurity (01/05/2024)   Epic    Worried About Radiation Protection Practitioner of Food in the Last Year: Never true    Ran Out of Food in the Last Year: Never true  Transportation Needs: No Transportation Needs (01/05/2024)   Epic    Lack of Transportation (Medical): No    Lack of Transportation (Non-Medical): No  Physical Activity: Inactive (01/05/2024)   Exercise Vital Sign    Days of Exercise per Week: 0 days    Minutes of Exercise per Session: 0 min  Stress: No Stress Concern Present (01/05/2024)   [employer] of Occupational Health - Occupational Stress Questionnaire    Feeling of Stress: Only a little  Social Connections: Moderately Isolated (01/05/2024)   Social Connection and Isolation Panel    Frequency of Communication with Friends and Family: More than three times a week    Frequency of Social Gatherings with Friends and Family: Once a week    Attends Religious Services: More than 4 times per year  Active Member of Clubs or Organizations: No    Attends Banker Meetings: Never    Marital Status: Never married  Intimate Partner Violence: Not At Risk (01/05/2024)   Epic    Fear of Current or Ex-Partner: No    Emotionally Abused: No    Physically Abused: No    Sexually Abused: No  Depression (PHQ2-9): High Risk (01/05/2024)   Depression (PHQ2-9)    PHQ-2 Score: 20  Alcohol Screen: Low Risk (01/05/2024)   Alcohol Screen    Last Alcohol Screening Score (AUDIT): 0  Housing: Low Risk (01/05/2024)   Epic    Unable to Pay for Housing in the Last Year: No    Number of Times Moved in the Last Year: 1    Homeless in the Last Year: No  Utilities: Not At Risk (01/05/2024)   Epic    Threatened with loss of utilities: No  Health Literacy: Adequate Health Literacy (01/05/2024)   B1300 Health Literacy    Frequency of need for help with  medical instructions: Never    Outpatient Medications Prior to Visit  Medication Sig Dispense Refill   amLODipine  (NORVASC ) 5 MG tablet Take 1 tablet (5 mg total) by mouth daily. 90 tablet 0   aspirin EC 325 MG tablet Take 325 mg by mouth daily.     buprenorphine (BUTRANS) 5 MCG/HR PTWK 1 patch once a week.     cetirizine (ZYRTEC) 10 MG tablet TK 1 T PO QD  0   ciclopirox  (PENLAC ) 8 % solution Apply topically at bedtime. Apply over nail and surrounding skin. Apply daily over previous coat. After seven (7) days, may remove with alcohol and continue cycle. 6.6 mL 0   cyanocobalamin  (VITAMIN B12) 1000 MCG/ML injection 1000 mcg IM weekly x 4, then once monthly 12 mL 0   diclofenac  Sodium (VOLTAREN ) 1 % GEL Apply 2 g topically 4 (four) times daily as needed. 100 g 5   HYDROcodone -acetaminophen  (NORCO) 10-325 MG tablet Take 1 tablet by mouth every 6 (six) hours as needed. 120 tablet 0   linaclotide  (LINZESS ) 290 MCG CAPS capsule Take 1 capsule (290 mcg total) by mouth daily as needed.     meloxicam  (MOBIC ) 15 MG tablet TAKE 1 TABLET(15 MG) BY MOUTH DAILY 30 tablet 0   metoprolol  succinate (TOPROL -XL) 25 MG 24 hr tablet Take 1 tablet (25 mg total) by mouth daily. 90 tablet 1   OZEMPIC , 1 MG/DOSE, 4 MG/3ML SOPN INJECT 1 MG UNDER THE SKIN ONE DAY A WEEK 3 mL 3   rosuvastatin  (CRESTOR ) 10 MG tablet Take 1 tablet (10 mg total) by mouth daily. 90 tablet 1   scopolamine  (TRANSDERM-SCOP) 1 MG/3DAYS APPLY 1 PATCH(1.5 MG) TOPICALLY TO THE SKIN EVERY 3 DAYS 4 patch 0   SYRINGE-NEEDLE, DISP, 3 ML (LUER LOCK SAFETY SYRINGES) 25G X 1 3 ML MISC 1 each by Does not apply route every 7 (seven) days. 8 each 0   tiZANidine (ZANAFLEX) 4 MG tablet Take 4-8 mg by mouth at bedtime.     No facility-administered medications prior to visit.    Allergies[1]  Review of Systems  Constitutional:  Negative for fever and malaise/fatigue.  HENT:  Negative for congestion.   Eyes:  Negative for blurred vision.   Respiratory:  Negative for shortness of breath.   Cardiovascular:  Negative for chest pain, palpitations and leg swelling.  Gastrointestinal:  Negative for abdominal pain, blood in stool and nausea.  Genitourinary:  Negative for dysuria and frequency.  Musculoskeletal:  Positive for joint pain. Negative for falls.  Skin:  Negative for rash.  Neurological:  Negative for dizziness, loss of consciousness and headaches.  Endo/Heme/Allergies:  Negative for environmental allergies.  Psychiatric/Behavioral:  Negative for depression. The patient is not nervous/anxious.        Objective:    Physical Exam Constitutional:      General: She is not in acute distress.    Appearance: Normal appearance. She is not ill-appearing or toxic-appearing.  HENT:     Head: Normocephalic and atraumatic.     Right Ear: External ear normal.     Left Ear: External ear normal.     Nose: Nose normal.  Eyes:     General:        Right eye: No discharge.        Left eye: No discharge.  Cardiovascular:     Rate and Rhythm: Normal rate and regular rhythm.     Heart sounds: Normal heart sounds.  Pulmonary:     Effort: Pulmonary effort is normal.  Skin:    Findings: No rash.  Neurological:     Mental Status: She is alert and oriented to person, place, and time.  Psychiatric:        Behavior: Behavior normal.    There were no vitals taken for this visit. Wt Readings from Last 3 Encounters:  01/05/24 156 lb (70.8 kg)  12/08/23 156 lb (70.8 kg)  11/22/23 160 lb (72.6 kg)    Diabetic Foot Exam - Simple   No data filed    Lab Results  Component Value Date   WBC 5.8 11/22/2023   HGB 11.2 (L) 11/22/2023   HCT 33.7 (L) 11/22/2023   PLT 254 11/22/2023   GLUCOSE 95 11/22/2023   CHOL 209 (H) 12/08/2023   TRIG 127.0 12/08/2023   HDL 67.80 12/08/2023   LDLCALC 116 (H) 12/08/2023   ALT 34 11/22/2023   AST 25 11/22/2023   NA 143 11/22/2023   K 3.8 11/22/2023   CL 108 11/22/2023   CREATININE 0.74  11/22/2023   BUN 15 11/22/2023   CO2 26 11/22/2023   TSH 1.05 07/29/2023   HGBA1C 5.6 04/17/2023    Lab Results  Component Value Date   TSH 1.05 07/29/2023   Lab Results  Component Value Date   WBC 5.8 11/22/2023   HGB 11.2 (L) 11/22/2023   HCT 33.7 (L) 11/22/2023   MCV 96.3 11/22/2023   PLT 254 11/22/2023   Lab Results  Component Value Date   NA 143 11/22/2023   K 3.8 11/22/2023   CO2 26 11/22/2023   GLUCOSE 95 11/22/2023   BUN 15 11/22/2023   CREATININE 0.74 11/22/2023   BILITOT <0.2 11/22/2023   ALKPHOS 144 (H) 11/22/2023   AST 25 11/22/2023   ALT 34 11/22/2023   PROT 6.1 (L) 11/22/2023   ALBUMIN 3.8 11/22/2023   CALCIUM  8.8 (L) 11/22/2023   ANIONGAP 9 11/22/2023   GFR 95.97 07/29/2023   Lab Results  Component Value Date   CHOL 209 (H) 12/08/2023   Lab Results  Component Value Date   HDL 67.80 12/08/2023   Lab Results  Component Value Date   LDLCALC 116 (H) 12/08/2023   Lab Results  Component Value Date   TRIG 127.0 12/08/2023   Lab Results  Component Value Date   CHOLHDL 3 12/08/2023   Lab Results  Component Value Date   HGBA1C 5.6 04/17/2023       Assessment & Plan:  Hypertension, unspecified type Assessment & Plan: Well controlled, no changes to meds. Encouraged heart healthy diet such as the DASH diet and exercise as tolerated.     Pain of hand, unspecified laterality Assessment & Plan: And swelling     Assessment and Plan Assessment & Plan      Harlene Horton, MD     [1] Allergies Allergen Reactions   Lyrica  [Pregabalin ]     nausea  "

## 2024-05-07 NOTE — Assessment & Plan Note (Signed)
 Well controlled, no changes to meds. Encouraged heart healthy diet such as the DASH diet and exercise as tolerated.

## 2024-05-09 ENCOUNTER — Ambulatory Visit: Admitting: Family Medicine

## 2024-05-09 ENCOUNTER — Encounter: Payer: Self-pay | Admitting: Family Medicine

## 2024-05-09 VITALS — BP 124/82 | Temp 98.1°F | Resp 16 | Ht 66.0 in | Wt 160.8 lb

## 2024-05-09 DIAGNOSIS — E785 Hyperlipidemia, unspecified: Secondary | ICD-10-CM | POA: Diagnosis not present

## 2024-05-09 DIAGNOSIS — G479 Sleep disorder, unspecified: Secondary | ICD-10-CM | POA: Insufficient documentation

## 2024-05-09 DIAGNOSIS — D509 Iron deficiency anemia, unspecified: Secondary | ICD-10-CM

## 2024-05-09 DIAGNOSIS — Z23 Encounter for immunization: Secondary | ICD-10-CM

## 2024-05-09 DIAGNOSIS — M79643 Pain in unspecified hand: Secondary | ICD-10-CM

## 2024-05-09 DIAGNOSIS — G4719 Other hypersomnia: Secondary | ICD-10-CM | POA: Diagnosis not present

## 2024-05-09 DIAGNOSIS — I10 Essential (primary) hypertension: Secondary | ICD-10-CM | POA: Diagnosis not present

## 2024-05-09 DIAGNOSIS — E538 Deficiency of other specified B group vitamins: Secondary | ICD-10-CM | POA: Diagnosis not present

## 2024-05-09 DIAGNOSIS — R0683 Snoring: Secondary | ICD-10-CM

## 2024-05-09 LAB — COMPREHENSIVE METABOLIC PANEL WITH GFR
ALT: 13 U/L (ref 3–35)
AST: 21 U/L (ref 5–37)
Albumin: 4 g/dL (ref 3.5–5.2)
Alkaline Phosphatase: 120 U/L — ABNORMAL HIGH (ref 39–117)
BUN: 12 mg/dL (ref 6–23)
CO2: 31 meq/L (ref 19–32)
Calcium: 8.8 mg/dL (ref 8.4–10.5)
Chloride: 105 meq/L (ref 96–112)
Creatinine, Ser: 0.7 mg/dL (ref 0.40–1.20)
GFR: 95.11 mL/min
Glucose, Bld: 72 mg/dL (ref 70–99)
Potassium: 3.8 meq/L (ref 3.5–5.1)
Sodium: 140 meq/L (ref 135–145)
Total Bilirubin: 0.4 mg/dL (ref 0.2–1.2)
Total Protein: 6.4 g/dL (ref 6.0–8.3)

## 2024-05-09 LAB — CBC WITH DIFFERENTIAL/PLATELET
Basophils Absolute: 0 K/uL (ref 0.0–0.1)
Basophils Relative: 0.9 % (ref 0.0–3.0)
Eosinophils Absolute: 0.1 K/uL (ref 0.0–0.7)
Eosinophils Relative: 1.9 % (ref 0.0–5.0)
HCT: 36.4 % (ref 36.0–46.0)
Hemoglobin: 12.2 g/dL (ref 12.0–15.0)
Lymphocytes Relative: 47.6 % — ABNORMAL HIGH (ref 12.0–46.0)
Lymphs Abs: 1.6 K/uL (ref 0.7–4.0)
MCHC: 33.6 g/dL (ref 30.0–36.0)
MCV: 93.1 fl (ref 78.0–100.0)
Monocytes Absolute: 0.2 K/uL (ref 0.1–1.0)
Monocytes Relative: 5.9 % (ref 3.0–12.0)
Neutro Abs: 1.5 K/uL (ref 1.4–7.7)
Neutrophils Relative %: 43.7 % (ref 43.0–77.0)
Platelets: 248 K/uL (ref 150.0–400.0)
RBC: 3.91 Mil/uL (ref 3.87–5.11)
RDW: 12.6 % (ref 11.5–15.5)
WBC: 3.4 K/uL — ABNORMAL LOW (ref 4.0–10.5)

## 2024-05-09 LAB — LIPID PANEL
Cholesterol: 171 mg/dL (ref 28–200)
HDL: 56.3 mg/dL
LDL Cholesterol: 97 mg/dL (ref 10–99)
NonHDL: 114.77
Total CHOL/HDL Ratio: 3
Triglycerides: 91 mg/dL (ref 10.0–149.0)
VLDL: 18.2 mg/dL (ref 0.0–40.0)

## 2024-05-09 LAB — VITAMIN B12: Vitamin B-12: 103 pg/mL — ABNORMAL LOW (ref 211–911)

## 2024-05-09 LAB — SEDIMENTATION RATE: Sed Rate: 4 mm/h (ref 0–30)

## 2024-05-09 LAB — VITAMIN D 25 HYDROXY (VIT D DEFICIENCY, FRACTURES): VITD: 52.77 ng/mL (ref 30.00–100.00)

## 2024-05-09 NOTE — Assessment & Plan Note (Signed)
 Supplement and monitor

## 2024-05-09 NOTE — Assessment & Plan Note (Signed)
 Referred to pulmonology for sleep study. Encouraged to minimize sedating meds.

## 2024-05-09 NOTE — Assessment & Plan Note (Signed)
 Supplement and monitor also check Vitamin D 

## 2024-05-09 NOTE — Patient Instructions (Addendum)
 Shingrix is the new shingles shot, 2 shots over 2-6 months, confirm coverage with insurance and document, then can return here for shots with nurse appt or at pharmacy   Annual Flu and covid shots  Given Tdap today  Prevnar 20 and RSV at age 59  Sleep Apnea  Sleep apnea is a condition that affects your breathing while you are sleeping. Your tongue or soft tissue in your throat may block the flow of air while you sleep. You may have shallow breathing or stop breathing for short periods of time. People with sleep apnea may snore loudly. There are three kinds of sleep apnea: Obstructive sleep apnea. This kind is caused by a blocked or collapsed airway. This is the most common. Central sleep apnea. This kind happens when the part of the brain that controls breathing does not send the correct signals to the muscles that control breathing. Mixed sleep apnea. This is a combination of obstructive and central sleep apnea. What are the causes? The most common cause of sleep apnea is a collapsed or blocked airway. What increases the risk? Being very overweight. Having family members with sleep apnea. Having a tongue or tonsils that are larger than normal. Having a small airway or jaw problems. Being older. What are the signs or symptoms? Loud snoring. Restless sleep. Trouble staying asleep. Being sleepy or tired during the day. Waking up gasping or choking. Having a headache in the morning. Mood swings. Having a hard time remembering things and concentrating. How is this diagnosed? A medical history. A physical exam. A sleep study. This is also called a polysomnography test. This test is done at a sleep lab or in your home while you are sleeping. How is this treated? Treatment may include: Sleeping on your side. Losing weight if you're overweight. Wearing an oral appliance. This is a mouthpiece that moves your lower jaw forward. Using a positive airway pressure (PAP) device to keep  your airways open while you sleep, such as: A continuous positive airway pressure (CPAP) device. This device gives forced air through a mask when you breathe out. This keeps your airways open. A bilevel positive airway pressure (BIPAP) device. This device gives forced air through a mask when you breathe in and when you breathe out to keep your airways open. Having surgery if other treatments do not work. If your sleep apnea is not treated, you may be at risk for: Heart failure. Heart attack. Stroke. Type 2 diabetes or a problem with your blood sugar called insulin resistance. Follow these instructions at home: Medicines Take your medicines only as told by your health care provider. Avoid alcohol, medicines to help you relax, and certain pain medicines. These may make sleep apnea worse. General instructions Do not smoke, vape, or use products with nicotine or tobacco in them. If you need help quitting, talk with your provider. If you were given a PAP device to open your airway while you sleep, use it as told by your provider. If you're having surgery, make sure to tell your provider you have sleep apnea. You may need to bring your PAP device with you. Contact a health care provider if: The PAP device that you were given to use during sleep bothers you or does not seem to be working. You do not feel better or you feel worse. Get help right away if: You have trouble breathing. You have chest pain. You have trouble talking. One side of your body feels weak. A part of your face  is hanging down. These symptoms may be an emergency. Call 911 right away. Do not wait to see if the symptoms will go away. Do not drive yourself to the hospital. This information is not intended to replace advice given to you by your health care provider. Make sure you discuss any questions you have with your health care provider. Document Revised: 01/22/2023 Document Reviewed: 06/26/2022 Elsevier Patient Education   2024 [employer].

## 2024-05-09 NOTE — Assessment & Plan Note (Signed)
 Encourage heart healthy diet such as MIND or DASH diet, increase exercise, avoid trans fats, simple carbohydrates and processed foods, consider a krill or fish or flaxseed oil cap daily.

## 2024-05-10 ENCOUNTER — Ambulatory Visit: Payer: Self-pay | Admitting: Family Medicine

## 2024-05-10 ENCOUNTER — Other Ambulatory Visit

## 2024-05-10 DIAGNOSIS — E538 Deficiency of other specified B group vitamins: Secondary | ICD-10-CM

## 2024-05-10 LAB — IRON,TIBC AND FERRITIN PANEL
%SAT: 19 % (ref 16–45)
Ferritin: 21 ng/mL (ref 16–232)
Iron: 57 ug/dL (ref 45–160)
TIBC: 303 ug/dL (ref 250–450)

## 2024-05-10 LAB — RHEUMATOID FACTOR: Rheumatoid fact SerPl-aCnc: <10 [IU]/mL

## 2024-05-10 LAB — ANA: Anti Nuclear Antibody (ANA): NEGATIVE

## 2024-05-13 ENCOUNTER — Ambulatory Visit: Payer: Self-pay | Admitting: Family Medicine

## 2024-05-13 LAB — INTRINSIC FACTOR BLOCKING ANTIBODY: Intrinsic Factor: NEGATIVE

## 2024-05-16 ENCOUNTER — Ambulatory Visit (INDEPENDENT_AMBULATORY_CARE_PROVIDER_SITE_OTHER)

## 2024-05-16 ENCOUNTER — Ambulatory Visit

## 2024-05-16 DIAGNOSIS — E538 Deficiency of other specified B group vitamins: Secondary | ICD-10-CM

## 2024-05-16 MED ORDER — CYANOCOBALAMIN 1000 MCG/ML IJ SOLN
1000.0000 ug | Freq: Once | INTRAMUSCULAR | Status: AC
  Administered 2024-05-16: 1000 ug via INTRAMUSCULAR

## 2024-05-16 NOTE — Progress Notes (Signed)
 Patient is here for weekly B12 injections per original order dated:   05/10/2024 Notify Vitamin B12 is low. Start vitamin B12 shots, 1000 mcg shots IM weekly x 4 doses then biweekly x 4 doses then monthly. Recheck Vitamin B12 level again in roughly 12 weeks.-Per Dr. CANDIE Horton, MD   Last B12 injection: N/A Begins Today  Last B12 level: 05/09/2024 B12 Level: 103  B12 given in Left Deltoid IM, and patient tolerated injection well.  Next B12 injection scheduled for: 1 Week: 05/24/2024 2 of 4 Weekly (Patient has B12 injections at home. May administer herself. Will call day before if needs to r/s for the following week.)

## 2024-05-17 ENCOUNTER — Other Ambulatory Visit: Payer: Self-pay | Admitting: Podiatry

## 2024-05-19 ENCOUNTER — Telehealth: Payer: Self-pay

## 2024-05-19 NOTE — Telephone Encounter (Signed)
 PA initiated via Covermymeds; KEY: [id number]. Awaiting determination.

## 2024-05-19 NOTE — Telephone Encounter (Signed)
 PA approved.  Effective 05/05/2024 - 05/19/2025

## 2024-05-24 ENCOUNTER — Ambulatory Visit (INDEPENDENT_AMBULATORY_CARE_PROVIDER_SITE_OTHER)

## 2024-05-24 DIAGNOSIS — E538 Deficiency of other specified B group vitamins: Secondary | ICD-10-CM | POA: Diagnosis not present

## 2024-05-24 MED ORDER — CYANOCOBALAMIN 1000 MCG/ML IJ SOLN
1000.0000 ug | Freq: Once | INTRAMUSCULAR | Status: AC
  Administered 2024-05-24: 1000 ug via INTRAMUSCULAR

## 2024-05-24 NOTE — Progress Notes (Signed)
 Pt here for weekly B12 injection per original order dated: 05/10/2024 per Dr. Domenica Her vitamin B 12 is very low. Notify Vitamin B12 is low. Start vitamin B12 shots, 1000 mcg shots IM weekly x 4 doses then biweekly x 4 doses then monthly. Recheck Vitamin B12 level again in roughly 12 weeks.   Last B12 injection: 05/16/2024  Last B12 level:  05/09/2024  B12 1000mcg given IM, left deltoid and pt tolerated injection well.  Next B12 injection scheduled for: pt declined due to self administering

## 2024-06-03 ENCOUNTER — Telehealth: Payer: Self-pay | Admitting: Family

## 2024-06-03 ENCOUNTER — Encounter: Payer: Self-pay | Admitting: Family

## 2024-06-03 DIAGNOSIS — G8929 Other chronic pain: Secondary | ICD-10-CM

## 2024-06-03 NOTE — Telephone Encounter (Signed)
 Copied from CRM #[id number]. Topic: Referral - Request for Referral >> Jun 03, 2024 10:56 AM Larissa RAMAN wrote: Did the patient discuss referral with their provider in the last year? Yes (If No - schedule appointment) (If Yes - send message)  Appointment offered? Yes  Type of order/referral and detailed reason for visit: Pain Management. Insurance, [employer], requesting referral.  Preference of office, provider, location: DR. Euna. Phone#  [phone] or [phone]   If referral order, have you been seen by this specialty before? Yes, Dr. Euna (If Yes, this issue or another issue? When? Where?  Can we respond through MyChart? No

## 2024-06-05 ENCOUNTER — Encounter: Payer: Self-pay | Admitting: Family

## 2024-06-06 NOTE — Telephone Encounter (Signed)
 Please advise.

## 2024-06-06 NOTE — Telephone Encounter (Signed)
 Order placed

## 2024-06-06 NOTE — Addendum Note (Signed)
 Addended by: DARYL SETTER on: 06/06/2024 12:07 PM   Modules accepted: Orders

## 2024-06-08 ENCOUNTER — Encounter: Payer: Self-pay | Admitting: Family

## 2024-06-08 ENCOUNTER — Telehealth: Payer: Self-pay

## 2024-06-08 NOTE — Telephone Encounter (Signed)
 Copied from CRM #[id number]. Topic: Referral - Status >> Jun 08, 2024 11:39 AM Berneda FALCON wrote: Reason for CRM: Patient states that the Neuro did get the referral, but that [facility] actually needs the referral sent to them in order for the office to schedule this patient. She states this is very important as this determines the shot that she has gotten for 15 years. She would like to speak to someone ASAP to make sure this is handled, please. I called [facility] and the clinic stated that the CMA was in with a patient but that she would call the patient back ASAP.  Patient callback is [phone] (home)

## 2024-06-08 NOTE — Telephone Encounter (Signed)
 Can you confirm that referral was placed in [facility] system as well?

## 2024-06-10 ENCOUNTER — Telehealth: Payer: Self-pay

## 2024-06-10 ENCOUNTER — Telehealth: Payer: Self-pay | Admitting: Podiatry

## 2024-06-10 ENCOUNTER — Ambulatory Visit: Admitting: Family

## 2024-06-10 ENCOUNTER — Telehealth: Payer: Self-pay | Admitting: Family

## 2024-06-10 VITALS — BP 122/77 | HR 100 | Temp 98.7°F | Resp 16 | Ht 66.0 in | Wt 159.0 lb

## 2024-06-10 DIAGNOSIS — G4730 Sleep apnea, unspecified: Secondary | ICD-10-CM

## 2024-06-10 DIAGNOSIS — E538 Deficiency of other specified B group vitamins: Secondary | ICD-10-CM

## 2024-06-10 DIAGNOSIS — G8929 Other chronic pain: Secondary | ICD-10-CM

## 2024-06-10 DIAGNOSIS — E785 Hyperlipidemia, unspecified: Secondary | ICD-10-CM

## 2024-06-10 DIAGNOSIS — I2510 Atherosclerotic heart disease of native coronary artery without angina pectoris: Secondary | ICD-10-CM

## 2024-06-10 DIAGNOSIS — I10 Essential (primary) hypertension: Secondary | ICD-10-CM

## 2024-06-10 MED ORDER — ROSUVASTATIN CALCIUM 20 MG PO TABS: 20.0000 mg | ORAL_TABLET | Freq: Every day | ORAL | 1 refills | Status: AC

## 2024-06-10 MED ORDER — CYANOCOBALAMIN 1000 MCG/ML IJ SOLN
1000.0000 ug | Freq: Once | INTRAMUSCULAR | Status: AC
  Administered 2024-06-10: 1000 ug via INTRAMUSCULAR

## 2024-06-10 NOTE — Assessment & Plan Note (Addendum)
 BP Readings from Last 3 Encounters:  06/10/24 122/77  05/09/24 124/82  12/08/23 109/68   At goal on metoprolol . States she has not been taking amlodipine . OK to remain off for now as long as BP continues to remain at goal.

## 2024-06-10 NOTE — Progress Notes (Signed)
 "  Subjective:     Patient ID: [name] Steele, female    DOB: Apr 12, 1966, 59 y.o.   MRN: [id number]  Chief Complaint  Patient presents with   Hip Pain    Patient reports right hip pain     Hip Pain     Discussed the use of AI scribe software for clinical note transcription with the patient, who gave verbal consent to proceed.  History of Present Illness [name] Steele is a 59 year old female who presents for follow-up on her medications and B12 deficiency.  She experiences persistent hip pain described as feeling like 'somebody just took that fist and just hit me right here.' The pain is exacerbated when getting out of the car and radiates down the leg. She received a hip injection recently, which did not alleviate the pain. She has previously undergone physical therapy and has had an MRI of her back. She has not had an MRI of the hip itself.  She has a history of sleep apnea and has lost significant weight since her diagnosis, attributed to the use of Ozempic . She is currently on Ozempic  1 mg weekly and reports no side effects from the medication. She is unsure how much the weight loss has impacted her sleep apnea and has not yet followed up with a sleep specialist, although she was contacted by them recently.  She reports ongoing issues with her hands swelling and feeling numb, particularly in the mornings. She describes difficulty with tasks such as twisting a bottle cap. She has not been taking amlodipine  for about a year but continues metoprolol . She has tried taking an older blood pressure pill for the swelling, but it has not helped. Her autoimmune studies, including rheumatoid and lupus tests, were normal.  She is receiving B12 injections for a deficiency, with today being her third weekly dose. She reports significant fatigue and sleepiness prior to starting B12 supplementation. She has not yet followed up with a sleep specialist regarding her sleepiness.  Her cholesterol  was checked a month ago, with an LDL of 97. She is currently on Crestor  10 mg. Her blood pressure is well-controlled on metoprolol  and amlodipine , although she is not currently taking amlodipine .  MR Lumbar spine performed in 2023 at [facility] noted the following:   Chronic lumbar radiculopathy with right hip pain due to sciatic nerve compression from a bulging disc. Previous MRI confirmed diagnosis. Recent hip injection ineffective. Pain management options discussed. - Encouraged follow-up with pain management and neurosurgery. - Discussed risks and benefits of surgical intervention with neurosurgery.    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   Hypertension    Myocardial infarction [facility]) 2023   Sciatica of right side 03/12/2022   Sleep apnea     Past Surgical History:  Procedure Laterality Date   ABDOMINAL HYSTERECTOMY     BREAST REDUCTION SURGERY     FOOT SURGERY     GASTRIC BYPASS     TUBAL LIGATION      Family History  Problem Relation Age of Onset   COPD Mother    Throat cancer Mother    Diabetes Father    Kidney disease Father    Breast cancer Sister     Social History  Socioeconomic History   Marital status: Single    Spouse name: Not on file   Number of children: Not on file   Years of education: Not on file   Highest education level: Not on file  Occupational History   Not on file  Tobacco Use   Smoking status: Never    Passive exposure: Never   Smokeless tobacco: Never  Vaping Use   Vaping status: Former  Substance and Sexual Activity   Alcohol use: No   Drug use: No   Sexual activity: Yes    Birth control/protection: Surgical  Other Topics Concern   Not on file  Social History Narrative   3 sons (one set of twins), grown   10 grandchildren  (family is local)   Lives with father or her children   Raising her great nephew (this is a stressor)   Works in therapist, occupational,  runs a venue for parties   Completed some college   Enjoys cooking   No pets    Social Drivers of Health   Tobacco Use: Low Risk (05/09/2024)   Patient History    Smoking Tobacco Use: Never    Smokeless Tobacco Use: Never    Passive Exposure: Never  Financial Resource Strain: Low Risk (01/05/2024)   Overall Financial Resource Strain (CARDIA)    Difficulty of Paying Living Expenses: Not very hard  Food Insecurity: No Food Insecurity (01/05/2024)   Epic    Worried About Radiation Protection Practitioner of Food in the Last Year: Never true    Ran Out of Food in the Last Year: Never true  Transportation Needs: No Transportation Needs (01/05/2024)   Epic    Lack of Transportation (Medical): No    Lack of Transportation (Non-Medical): No  Physical Activity: Inactive (01/05/2024)   Exercise Vital Sign    Days of Exercise per Week: 0 days    Minutes of Exercise per Session: 0 min  Stress: No Stress Concern Present (01/05/2024)   [employer] of Occupational Health - Occupational Stress Questionnaire    Feeling of Stress: Only a little  Social Connections: Moderately Isolated (01/05/2024)   Social Connection and Isolation Panel    Frequency of Communication with Friends and Family: More than three times a week    Frequency of Social Gatherings with Friends and Family: Once a week    Attends Religious Services: More than 4 times per year    Active Member of Clubs or Organizations: No    Attends Banker Meetings: Never    Marital Status: Never married  Intimate Partner Violence: Not At Risk (01/05/2024)   Epic    Fear of Current or Ex-Partner: No    Emotionally Abused: No    Physically Abused: No    Sexually Abused: No  Depression (PHQ2-9): Medium Risk (05/09/2024)   Depression (PHQ2-9)    PHQ-2 Score: 7  Alcohol Screen: Low Risk (01/05/2024)   Alcohol Screen    Last Alcohol  Screening Score (AUDIT): 0  Housing: Low Risk (01/05/2024)   Epic    Unable to Pay for Housing in the Last Year: No    Number of Times Moved in the Last Year: 1    Homeless in the Last Year: No  Utilities: Not At Risk (01/05/2024)   Epic    Threatened with loss of utilities: No  Health Literacy: Adequate Health Literacy (01/05/2024)   B1300 Health Literacy    Frequency of need for help with medical instructions: Never    Outpatient Medications  Prior to Visit  Medication Sig Dispense Refill   cetirizine (ZYRTEC) 10 MG tablet TK 1 T PO QD  0   ciclopirox  (PENLAC ) 8 % solution Apply topically at bedtime. Apply over nail and surrounding skin. Apply daily over previous coat. After seven (7) days, may remove with alcohol and continue cycle. 6.6 mL 0   cyanocobalamin  (VITAMIN B12) 1000 MCG/ML injection 1000 mcg IM weekly x 4, then once monthly 12 mL 0   HYDROcodone -acetaminophen  (NORCO) 10-325 MG tablet Take 1 tablet by mouth every 6 (six) hours as needed. 120 tablet 0   linaclotide  (LINZESS ) 290 MCG CAPS capsule Take 1 capsule (290 mcg total) by mouth daily as needed.     meloxicam  (MOBIC ) 15 MG tablet TAKE 1 TABLET(15 MG) BY MOUTH DAILY 30 tablet 0   metoprolol  succinate (TOPROL -XL) 25 MG 24 hr tablet Take 1 tablet (25 mg total) by mouth daily. 90 tablet 1   OZEMPIC , 1 MG/DOSE, 4 MG/3ML SOPN INJECT 1 MG UNDER THE SKIN ONE DAY A WEEK 3 mL 3   SYRINGE-NEEDLE, DISP, 3 ML (LUER LOCK SAFETY SYRINGES) 25G X 1 3 ML MISC 1 each by Does not apply route every 7 (seven) days. 8 each 0   tiZANidine (ZANAFLEX) 4 MG tablet Take 4-8 mg by mouth at bedtime.     amLODipine  (NORVASC ) 5 MG tablet Take 1 tablet (5 mg total) by mouth daily. 90 tablet 0   rosuvastatin  (CRESTOR ) 10 MG tablet Take 1 tablet (10 mg total) by mouth daily. 90 tablet 1   scopolamine  (TRANSDERM-SCOP) 1 MG/3DAYS APPLY 1 PATCH(1.5 MG) TOPICALLY TO THE SKIN EVERY 3 DAYS (Patient not taking: Reported on 05/09/2024) 4 patch 0   No  facility-administered medications prior to visit.    Allergies[1]  ROS See HPI    Objective:    Physical Exam Constitutional:      General: She is not in acute distress.    Appearance: Normal appearance. She is well-developed.  HENT:     Head: Normocephalic and atraumatic.     Right Ear: External ear normal.     Left Ear: External ear normal.  Eyes:     General: No scleral icterus. Neck:     Thyroid : No thyromegaly.  Cardiovascular:     Rate and Rhythm: Normal rate and regular rhythm.     Heart sounds: Normal heart sounds. No murmur heard. Pulmonary:     Effort: Pulmonary effort is normal. No respiratory distress.     Breath sounds: Normal breath sounds. No wheezing.  Musculoskeletal:     Cervical back: Neck supple.     Comments: + right sided straight leg raise  Skin:    General: Skin is warm and dry.  Neurological:     Mental Status: She is alert and oriented to person, place, and time.  Psychiatric:        Mood and Affect: Mood normal.        Behavior: Behavior normal.        Thought Content: Thought content normal.        Judgment: Judgment normal.      BP 122/77 (BP Location: Right Arm, Patient Position: Sitting, Cuff Size: Normal)   Pulse 100   Temp 98.7 F (37.1 C) (Oral)   Resp 16   Ht 5' 6 (1.676 m)   Wt 159 lb (72.1 kg)   SpO2 97%   BMI 25.66 kg/m  Wt Readings from Last 3 Encounters:  06/10/24 159 lb (72.1 kg)  05/09/24 160 lb 12.8 oz (72.9 kg)  01/05/24 156 lb (70.8 kg)       Assessment & Plan:   Problem List Items Addressed This Visit       Unprioritized   Sleep apnea   + daytime somnolence.  Significant weight loss since diagnosis. Referral to sleep specialist for re-evaluation of sleep apnea severity and potential CPAP therapy. - Follow up with sleep specialist to assess current sleep apnea status and consider CPAP therapy if indicated.      Hypertension   BP Readings from Last 3 Encounters:  06/10/24 122/77  05/09/24 124/82   12/08/23 109/68   At goal on metoprolol . States she has not been taking amlodipine . OK to remain off for now as long as BP continues to remain at goal.        Relevant Medications   rosuvastatin  (CRESTOR ) 20 MG tablet   Hyperlipidemia   Lab Results  Component Value Date   CHOL 171 05/09/2024   HDL 56.30 05/09/2024   LDLCALC 97 05/09/2024   TRIG 91.0 05/09/2024   CHOLHDL 3 05/09/2024   Will increase crestor  to 20mg  to get LDL to goal <70.      Relevant Medications   rosuvastatin  (CRESTOR ) 20 MG tablet   Coronary artery disease involving native heart without angina pectoris   Continues beta blocker, add aspirin 81mg  for secondary prevention. Has not established with cardiology. Will place referral.        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 Previous MRI confirmed diagnosis. Recent hip injection ineffective. Pain management options discussed. - Encouraged follow-up with pain management and neurosurgery. - Discussed risks and benefits of surgical intervention with neurosurgery.       B12 deficiency - Primary    Ongoing management with intramuscular B12 injections. Recent numbness in hands may relate to low B12 levels. - Administered third B12 injection today. - Scheduled follow-up in one week for another B12 injection, then transition to monthly injections.      Assessment & Plan      I have discontinued [name] Steele's amLODipine , scopolamine , and rosuvastatin . I am also having her start on rosuvastatin . Additionally, I am having her maintain her cetirizine, tiZANidine, HYDROcodone -acetaminophen , linaclotide , metoprolol  succinate, Luer Lock Safety Syringes, ciclopirox , Ozempic  (1 MG/DOSE), cyanocobalamin , and meloxicam . We administered  cyanocobalamin .  Meds ordered this encounter  Medications   rosuvastatin  (CRESTOR ) 20 MG tablet    Sig: Take 1 tablet (20 mg total) by mouth daily.    Dispense:  90 tablet    Refill:  1    Supervising Provider:   DOMENICA BLACKBIRD A [[id number]]   cyanocobalamin  (VITAMIN B12) injection 1,000 mcg      [1]  Allergies Allergen Reactions   Lyrica  [Pregabalin ]     nausea   "

## 2024-06-10 NOTE — Assessment & Plan Note (Addendum)
" °  Ongoing management with intramuscular B12 injections. Recent numbness in hands may relate to low B12 levels. - Administered third B12 injection today. - Scheduled follow-up in one week for another B12 injection, then transition to monthly injections. "

## 2024-06-10 NOTE — Assessment & Plan Note (Signed)
 Lab Results  Component Value Date   CHOL 171 05/09/2024   HDL 56.30 05/09/2024   LDLCALC 97 05/09/2024   TRIG 91.0 05/09/2024   CHOLHDL 3 05/09/2024   Will increase crestor  to 20mg  to get LDL to goal <70.

## 2024-06-10 NOTE — Assessment & Plan Note (Signed)
 Continues beta blocker, add aspirin 81mg  for secondary prevention. Has not established with cardiology. Will place referral.

## 2024-06-10 NOTE — Telephone Encounter (Signed)
 Left VM for the patient, [name] Steele, to call back and set up an appt with Dr. Sikora

## 2024-06-10 NOTE — Patient Instructions (Signed)
" °  VISIT SUMMARY: During today's visit, we discussed your ongoing hip pain, B12 deficiency, sleep apnea, and cardiovascular health. We reviewed your current medications and made adjustments where necessary. We also discussed the need for follow-up with various specialists to manage your conditions effectively.  YOUR PLAN: -LUMBAR RADICULOPATHY: Lumbar radiculopathy is a condition where a nerve in the lower back is compressed, causing pain that can radiate down the leg. We discussed pain management options and the potential need for surgical intervention. You are encouraged to follow up with pain management and neurosurgery for further evaluation.  -VITAMIN B12 DEFICIENCY: Vitamin B12 deficiency can cause fatigue, numbness, and other symptoms. You received your third B12 injection today, and we will continue with weekly injections for one more week before transitioning to monthly injections.  -OBSTRUCTIVE SLEEP APNEA: Obstructive sleep apnea is a condition where breathing repeatedly stops and starts during sleep. Given your significant weight loss, you should follow up with a sleep specialist to reassess the severity of your sleep apnea and consider CPAP therapy if needed.  -CORONARY ARTERY DISEASE: Coronary artery disease is a condition where the blood vessels supplying the heart are narrowed or blocked. Your blood pressure is well-controlled, but your cholesterol levels indicate the need for a higher dose of your statin medication. We have increased your rosuvastatin  to 20 mg daily and recommend follow-up with cardiology.  -HYPERLIPIDEMIA: Hyperlipidemia is a condition where there are high levels of fats (lipids) in the blood. Your recent LDL level was above the target, so we have increased your rosuvastatin  to 20 mg daily.  -HYPERTENSION: Hypertension is high blood pressure. Your blood pressure today is well-controlled at 122/77 mmHg. Continue taking your current medications, amlodipine  and  metoprolol , as prescribed.  INSTRUCTIONS: Please follow up with pain management and neurosurgery for your lumbar radiculopathy. Schedule an appointment with a sleep specialist to reassess your sleep apnea. Continue with your B12 injections as planned and follow up in one week for your next dose. Ensure you follow up with cardiology for ongoing management of your coronary artery disease. Continue taking your medications as prescribed and monitor your blood pressure regularly.    Contains text generated by [employer].   "

## 2024-06-10 NOTE — Telephone Encounter (Signed)
 Patient called needing to schedule an appointment.

## 2024-06-10 NOTE — Assessment & Plan Note (Signed)
+   daytime somnolence.  Significant weight loss since diagnosis. Referral to sleep specialist for re-evaluation of sleep apnea severity and potential CPAP therapy. - Follow up with sleep specialist to assess current sleep apnea status and consider CPAP therapy if indicated.

## 2024-06-10 NOTE — Telephone Encounter (Signed)
 Please advise pt that I reviewed her medications. I would like for her to add aspirin 81mg  once daily to help prevent future heart attack.

## 2024-06-10 NOTE — Assessment & Plan Note (Signed)
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 Previous MRI confirmed diagnosis. Recent hip injection ineffective. Pain management options discussed. - Encouraged follow-up with pain management and neurosurgery. - Discussed risks and benefits of surgical intervention with neurosurgery.  "

## 2024-06-28 ENCOUNTER — Ambulatory Visit

## 2024-07-19 ENCOUNTER — Ambulatory Visit

## 2025-01-05 ENCOUNTER — Ambulatory Visit
